# Patient Record
Sex: Male | Born: 1970 | Race: White | Hispanic: No | Marital: Single | Smoking: Never smoker
Health system: Southern US, Community
[De-identification: ages and names within clinical notes are randomized; demographics above are authoritative.]

## PROBLEM LIST (undated history)

## (undated) DIAGNOSIS — J45909 Unspecified asthma, uncomplicated: Secondary | ICD-10-CM

## (undated) DIAGNOSIS — F329 Major depressive disorder, single episode, unspecified: Secondary | ICD-10-CM

## (undated) DIAGNOSIS — E119 Type 2 diabetes mellitus without complications: Secondary | ICD-10-CM

## (undated) DIAGNOSIS — G4700 Insomnia, unspecified: Secondary | ICD-10-CM

## (undated) DIAGNOSIS — F32A Depression, unspecified: Secondary | ICD-10-CM

## (undated) HISTORY — PX: EYE SURGERY: SHX253

## (undated) HISTORY — PX: TONSILLECTOMY: SUR1361

---

## 2013-10-14 ENCOUNTER — Emergency Department (HOSPITAL_COMMUNITY): Payer: BC Managed Care – PPO

## 2013-10-14 ENCOUNTER — Encounter (HOSPITAL_COMMUNITY): Payer: Self-pay | Admitting: Emergency Medicine

## 2013-10-14 ENCOUNTER — Emergency Department (HOSPITAL_COMMUNITY)
Admission: EM | Admit: 2013-10-14 | Discharge: 2013-10-14 | Disposition: A | Discharge status: Home / Self Care | Payer: BC Managed Care – PPO | Attending: Emergency Medicine | Admitting: Emergency Medicine

## 2013-10-14 DIAGNOSIS — Y9289 Other specified places as the place of occurrence of the external cause: Secondary | ICD-10-CM | POA: Diagnosis not present

## 2013-10-14 DIAGNOSIS — F3289 Other specified depressive episodes: Secondary | ICD-10-CM | POA: Diagnosis not present

## 2013-10-14 DIAGNOSIS — W1809XA Striking against other object with subsequent fall, initial encounter: Secondary | ICD-10-CM | POA: Diagnosis not present

## 2013-10-14 DIAGNOSIS — G4700 Insomnia, unspecified: Secondary | ICD-10-CM | POA: Diagnosis not present

## 2013-10-14 DIAGNOSIS — R55 Syncope and collapse: Secondary | ICD-10-CM | POA: Diagnosis not present

## 2013-10-14 DIAGNOSIS — E119 Type 2 diabetes mellitus without complications: Secondary | ICD-10-CM | POA: Insufficient documentation

## 2013-10-14 DIAGNOSIS — S0100XA Unspecified open wound of scalp, initial encounter: Secondary | ICD-10-CM | POA: Insufficient documentation

## 2013-10-14 DIAGNOSIS — R404 Transient alteration of awareness: Secondary | ICD-10-CM | POA: Insufficient documentation

## 2013-10-14 DIAGNOSIS — S0101XA Laceration without foreign body of scalp, initial encounter: Secondary | ICD-10-CM

## 2013-10-14 DIAGNOSIS — Y9389 Activity, other specified: Secondary | ICD-10-CM | POA: Insufficient documentation

## 2013-10-14 DIAGNOSIS — F329 Major depressive disorder, single episode, unspecified: Secondary | ICD-10-CM | POA: Insufficient documentation

## 2013-10-14 DIAGNOSIS — Z79899 Other long term (current) drug therapy: Secondary | ICD-10-CM | POA: Diagnosis not present

## 2013-10-14 HISTORY — DX: Type 2 diabetes mellitus without complications: E11.9

## 2013-10-14 HISTORY — DX: Insomnia, unspecified: G47.00

## 2013-10-14 HISTORY — DX: Depression, unspecified: F32.A

## 2013-10-14 HISTORY — DX: Major depressive disorder, single episode, unspecified: F32.9

## 2013-10-14 LAB — CBC WITH DIFFERENTIAL/PLATELET
Basophils Absolute: 0 10*3/uL (ref 0.0–0.1)
Basophils Relative: 0 % (ref 0–1)
EOS ABS: 0.1 10*3/uL (ref 0.0–0.7)
EOS PCT: 1 % (ref 0–5)
HCT: 48.9 % (ref 39.0–52.0)
Hemoglobin: 16.5 g/dL (ref 13.0–17.0)
LYMPHS ABS: 1.7 10*3/uL (ref 0.7–4.0)
Lymphocytes Relative: 18 % (ref 12–46)
MCH: 30.3 pg (ref 26.0–34.0)
MCHC: 33.7 g/dL (ref 30.0–36.0)
MCV: 89.9 fL (ref 78.0–100.0)
MONO ABS: 0.8 10*3/uL (ref 0.1–1.0)
Monocytes Relative: 9 % (ref 3–12)
Neutro Abs: 7.1 10*3/uL (ref 1.7–7.7)
Neutrophils Relative %: 72 % (ref 43–77)
PLATELETS: 218 10*3/uL (ref 150–400)
RBC: 5.44 MIL/uL (ref 4.22–5.81)
RDW: 13.4 % (ref 11.5–15.5)
WBC: 9.8 10*3/uL (ref 4.0–10.5)

## 2013-10-14 LAB — URINALYSIS, ROUTINE W REFLEX MICROSCOPIC
Glucose, UA: NEGATIVE mg/dL
Hgb urine dipstick: NEGATIVE
Ketones, ur: 15 mg/dL — AB
Leukocytes, UA: NEGATIVE
Nitrite: NEGATIVE
PROTEIN: NEGATIVE mg/dL
Specific Gravity, Urine: 1.028 (ref 1.005–1.030)
UROBILINOGEN UA: 1 mg/dL (ref 0.0–1.0)
pH: 6 (ref 5.0–8.0)

## 2013-10-14 LAB — COMPREHENSIVE METABOLIC PANEL
ALT: 8 U/L (ref 0–53)
ANION GAP: 12 (ref 5–15)
AST: 10 U/L (ref 0–37)
Albumin: 3.8 g/dL (ref 3.5–5.2)
Alkaline Phosphatase: 52 U/L (ref 39–117)
BUN: 15 mg/dL (ref 6–23)
CALCIUM: 9.6 mg/dL (ref 8.4–10.5)
CO2: 28 mEq/L (ref 19–32)
CREATININE: 1.12 mg/dL (ref 0.50–1.35)
Chloride: 99 mEq/L (ref 96–112)
GFR calc Af Amer: >90 mL/min (ref >90)
GFR calc non Af Amer: 80 mL/min — ABNORMAL LOW (ref >90)
GLUCOSE: 117 mg/dL — AB (ref 70–99)
Potassium: 3.8 mEq/L (ref 3.7–5.3)
SODIUM: 139 meq/L (ref 137–147)
TOTAL PROTEIN: 7.3 g/dL (ref 6.0–8.3)
Total Bilirubin: 0.5 mg/dL (ref 0.3–1.2)

## 2013-10-14 LAB — TROPONIN I

## 2013-10-14 MED ORDER — TRAMADOL HCL 50 MG PO TABS: 50.0000 mg | ORAL_TABLET | Freq: Four times a day (QID) | ORAL | Status: DC | PRN

## 2013-10-14 MED ORDER — SODIUM CHLORIDE 0.9 % IV BOLUS (SEPSIS)
1000.0000 mL | Freq: Once | INTRAVENOUS | Status: AC
  Administered 2013-10-14: 1000 mL via INTRAVENOUS

## 2013-10-14 MED ORDER — IBUPROFEN 600 MG PO TABS: 600.0000 mg | ORAL_TABLET | Freq: Four times a day (QID) | ORAL | Status: DC | PRN

## 2013-10-14 NOTE — ED Notes (Signed)
Bed: WA09 Expected date: 10/14/13 Expected time: 1:52 AM Means of arrival: Ambulance Comments: Bed 9, EMS, 43 M, Syncopy

## 2013-10-14 NOTE — ED Notes (Signed)
Patient states he was at home and he has not been eating much because he is not hungry. Patient also took some sleeping medicine tonight and started to feel dizzy. Then the patient held on to the chair and then the next thing he knew he woke up on the floor. He stated that he was real sweaty. Patient feel fine now other than the pain from the laceration on the back of his head.

## 2013-10-14 NOTE — Discharge Instructions (Signed)
Head Injury °You have received a head injury. It does not appear serious at this time. Headaches and vomiting are common following head injury. It should be easy to awaken from sleeping. Sometimes it is necessary for you to stay in the emergency department for a while for observation. Sometimes admission to the hospital may be needed. After injuries such as yours, most problems occur within the first 24 hours, but side effects may occur up to 7-10 days after the injury. It is important for you to carefully monitor your condition and contact your health care provider or seek immediate medical care if there is a change in your condition. °WHAT ARE THE TYPES OF HEAD INJURIES? °Head injuries can be as minor as a bump. Some head injuries can be more severe. More severe head injuries include: °· A jarring injury to the brain (concussion). °· A bruise of the brain (contusion). This mean there is bleeding in the brain that can cause swelling. °· A cracked skull (skull fracture). °· Bleeding in the brain that collects, clots, and forms a bump (hematoma). °WHAT CAUSES A HEAD INJURY? °A serious head injury is most likely to happen to someone who is in a car wreck and is not wearing a seat belt. Other causes of major head injuries include bicycle or motorcycle accidents, sports injuries, and falls. °HOW ARE HEAD INJURIES DIAGNOSED? °A complete history of the event leading to the injury and your current symptoms will be helpful in diagnosing head injuries. Many times, pictures of the brain, such as CT or MRI are needed to see the extent of the injury. Often, an overnight hospital stay is necessary for observation.  °WHEN SHOULD I SEEK IMMEDIATE MEDICAL CARE?  °You should get help right away if: °· You have confusion or drowsiness. °· You feel sick to your stomach (nauseous) or have continued, forceful vomiting. °· You have dizziness or unsteadiness that is getting worse. °· You have severe, continued headaches not relieved by  medicine. Only take over-the-counter or prescription medicines for pain, fever, or discomfort as directed by your health care provider. °· You do not have normal function of the arms or legs or are unable to walk. °· You notice changes in the black spots in the center of the colored part of your eye (pupil). °· You have a clear or bloody fluid coming from your nose or ears. °· You have a loss of vision. °During the next 24 hours after the injury, you must stay with someone who can watch you for the warning signs. This person should contact local emergency services (911 in the [place] if you have seizures, you become unconscious, or you are unable to wake up. °HOW CAN I PREVENT A HEAD INJURY IN THE FUTURE? °The most important factor for preventing major head injuries is avoiding motor vehicle accidents.  To minimize the potential for damage to your head, it is crucial to wear seat belts while riding in motor vehicles. Wearing helmets while bike riding and playing collision sports (like football) is also helpful. Also, avoiding dangerous activities around the house will further help reduce your risk of head injury.  °WHEN CAN I RETURN TO NORMAL ACTIVITIES AND ATHLETICS? °You should be reevaluated by your health care provider before returning to these activities. If you have any of the following symptoms, you should not return to activities or contact sports until 1 week after the symptoms have stopped: °· Persistent headache. °· Dizziness or vertigo. °· Poor attention and concentration. °· Confusion. °·   Memory problems. °· Nausea or vomiting. °· Fatigue or tire easily. °· Irritability. °· Intolerant of bright lights or loud noises. °· Anxiety or depression. °· Disturbed sleep. °MAKE SURE YOU:  °· Understand these instructions. °· Will watch your condition. °· Will get help right away if you are not doing well or get worse. °Document Released: 02/07/2005 Document Revised: 02/12/2013 Document Reviewed:  10/15/2012 °Exit[employer]® Patient Information ©2015 [employer]. This information is not intended to replace advice given to you by your health care provider. Make sure you discuss any questions you have with your health care provider. ° °Syncope °Syncope is a medical term for fainting or passing out. This means you lose consciousness and drop to the ground. People are generally unconscious for less than 5 minutes. You may have some muscle twitches for up to 15 seconds before waking up and returning to normal. Syncope occurs more often in older adults, but it can happen to anyone. While most causes of syncope are not dangerous, syncope can be a sign of a serious medical problem. It is important to seek medical care.  °CAUSES  °Syncope is caused by a sudden drop in blood flow to the brain. The specific cause is often not determined. Factors that can bring on syncope include: °· Taking medicines that lower blood pressure. °· Sudden changes in posture, such as standing up quickly. °· Taking more medicine than prescribed. °· Standing in one place for too long. °· Seizure disorders. °· Dehydration and excessive exposure to heat. °· Low blood sugar (hypoglycemia). °· Straining to have a bowel movement. °· Heart disease, irregular heartbeat, or other circulatory problems. °· Fear, emotional distress, seeing blood, or severe pain. °SYMPTOMS  °Right before fainting, you may: °· Feel dizzy or light-headed. °· Feel nauseous. °· See all white or all black in your field of vision. °· Have cold, clammy skin. °DIAGNOSIS  °Your health care provider will ask about your symptoms, perform a physical exam, and perform an electrocardiogram (ECG) to record the electrical activity of your heart. Your health care provider may also perform other heart or blood tests to determine the cause of your syncope which may include: °· Transthoracic echocardiogram (TTE). During echocardiography, sound waves are used to evaluate how blood flows through  your heart. °· Transesophageal echocardiogram (TEE). °· Cardiac monitoring. This allows your health care provider to monitor your heart rate and rhythm in real time. °· Holter monitor. This is a portable device that records your heartbeat and can help diagnose heart arrhythmias. It allows your health care provider to track your heart activity for several days, if needed. °· Stress tests by exercise or by giving medicine that makes the heart beat faster. °TREATMENT  °In most cases, no treatment is needed. Depending on the cause of your syncope, your health care provider may recommend changing or stopping some of your medicines. °HOME CARE INSTRUCTIONS °· Have someone stay with you until you feel stable. °· Do not drive, use machinery, or play sports until your health care provider says it is okay. °· Keep all follow-up appointments as directed by your health care provider. °· Lie down right away if you start feeling like you might faint. Breathe deeply and steadily. Wait until all the symptoms have passed. °· Drink enough fluids to keep your urine clear or pale yellow. °· If you are taking blood pressure or heart medicine, get up slowly and take several minutes to sit and then stand. This can reduce dizziness. °SEEK IMMEDIATE MEDICAL CARE   IF:  °· You have a severe headache. °· You have unusual pain in the chest, abdomen, or back. °· You are bleeding from your mouth or rectum, or you have black or tarry stool. °· You have an irregular or very fast heartbeat. °· You have pain with breathing. °· You have repeated fainting or seizure-like jerking during an episode. °· You faint when sitting or lying down. °· You have confusion. °· You have trouble walking. °· You have severe weakness. °· You have vision problems. °If you fainted, call your local emergency services (911 in [place] Do not drive yourself to the hospital.  °MAKE SURE YOU: °· Understand these instructions. °· Will watch your condition. °· Will get help right  away if you are not doing well or get worse. °Document Released: 02/07/2005 Document Revised: 02/12/2013 Document Reviewed: 04/08/2011 °Exit[employer]® Patient Information ©2015 [employer]. This information is not intended to replace advice given to you by your health care provider. Make sure you discuss any questions you have with your health care provider. ° °

## 2013-10-14 NOTE — ED Provider Notes (Signed)
CSN: [id number]     Arrival date & time 10/14/13  0215 History   First MD Initiated Contact with Patient 10/14/13 [phone]     Chief Complaint  Patient presents with  . Loss of Consciousness     (Consider location/radiation/quality/duration/timing/severity/associated sxs/prior Treatment) The history is limited by the condition of the patient.   Patient states that earlier this evening he stood up from the couch. He became lightheaded. He then fell to the ground. Loss of consciousness for an unknown length of time. He struck his head had a small laceration to the right occiput. His last tetanus can last 2 years. He no longer has any lightheadedness. He denies any hearing changes. He's had no vision changes. He's had no focal weakness or numbness. Patient denied any chest pain or shortness of breath at any point. Patient denies any neck pain. Past Medical History  Diagnosis Date  . Depression   . Insomnia   . Diabetes mellitus without complication    Past Surgical History  Procedure Laterality Date  . Eye surgery      Cataract removed   History reviewed. No pertinent family history. History  Substance Use Topics  . Smoking status: Never Smoker   . Smokeless tobacco: Never Used  . Alcohol Use: No    Review of Systems  Constitutional: Negative for fever and chills.  Eyes: Negative for visual disturbance.  Respiratory: Negative for shortness of breath and wheezing.   Cardiovascular: Negative for chest pain, palpitations and leg swelling.  Gastrointestinal: Negative for nausea, vomiting, abdominal pain and diarrhea.  Musculoskeletal: Negative for back pain, neck pain and neck stiffness.  Skin: Positive for wound.  Neurological: Positive for dizziness, syncope and light-headedness. Negative for weakness, numbness and headaches.  All other systems reviewed and are negative.     Allergies  Review of patient's allergies indicates no known allergies.  Home Medications   Prior to  Admission medications   Medication Sig Start Date End Date Taking? Authorizing Provider  FLUoxetine (PROZAC) 20 MG tablet Take 20 mg by mouth daily.   Yes Historical Provider, MD  traZODone (DESYREL) 100 MG tablet Take 100 mg by mouth at bedtime as needed for sleep.   Yes Historical Provider, MD   BP 123/65  Pulse 56  Temp(Src) 98 F (36.7 C) (Oral)  Resp 18  SpO2 99% Physical Exam  Nursing note and vitals reviewed. Constitutional: He is oriented to person, place, and time. He appears well-developed and well-nourished. No distress.  HENT:  Head: Normocephalic and atraumatic.  Mouth/Throat: Oropharynx is clear and moist.  Once in a laceration to the scalp in the occipital region on the right. No active bleeding.  Eyes: EOM are normal. Pupils are equal, round, and reactive to light.  Neck: Normal range of motion. Neck supple.  No posterior midline cervical tenderness palpation.  Cardiovascular: Normal rate and regular rhythm.   Pulmonary/Chest: Effort normal and breath sounds normal. No respiratory distress. He has no wheezes. He has no rales.  Abdominal: Soft. Bowel sounds are normal.  Musculoskeletal: Normal range of motion. He exhibits no edema and no tenderness.  No calf swelling or tenderness.  Neurological: He is alert and oriented to person, place, and time.  Patient is alert and oriented x3 with clear, goal oriented speech. Patient has 5/5 motor in all extremities. Sensation is intact to light touch. Bilateral finger-to-nose is normal with no signs of dysmetria. Patient has a normal gait and walks without assistance.   Skin: Skin is  warm and dry. No rash noted. No erythema.  Psychiatric: He has a normal mood and affect. His behavior is normal.    ED Course  LACERATION REPAIR Date/Time: 10/14/2013 4:37 AM Performed by: Loren Racer Authorized by: Ranae Palms, Wyndell Cardiff Patient identity confirmed: verbally with patient Body area: head/neck Location details: scalp Laceration  length: 2 cm Foreign bodies: no foreign bodies Tendon involvement: none Nerve involvement: none Vascular damage: no Irrigation solution: saline Amount of cleaning: standard Debridement: none Degree of undermining: none Skin closure: staples Number of sutures: 2 Approximation: close Approximation difficulty: simple   (including critical care time) Labs Review Labs Reviewed  COMPREHENSIVE METABOLIC PANEL - Abnormal; Notable for the following:    Glucose, Bld 117 (*)    GFR calc non Af Amer 80 (*)    All other components within normal limits  URINALYSIS, ROUTINE W REFLEX MICROSCOPIC - Abnormal; Notable for the following:    Color, Urine AMBER (*)    Bilirubin Urine SMALL (*)    Ketones, ur 15 (*)    All other components within normal limits  CBC WITH DIFFERENTIAL  TROPONIN I    Imaging Review Ct Head Wo Contrast  10/14/2013   CLINICAL DATA:  Patient passed out, fell and hit the back of head. Laceration to the back of head.  EXAM: CT HEAD WITHOUT CONTRAST  TECHNIQUE: Contiguous axial images were obtained from the base of the skull through the vertex without intravenous contrast.  COMPARISON:  None.  FINDINGS: Ventricles and sulci appear symmetrical. No mass effect or midline shift. No abnormal extra-axial fluid collections. Gray-white matter junctions are distinct. Basal cisterns are not effaced. No evidence of acute intracranial hemorrhage. No depressed skull fractures. Visualized paranasal sinuses and mastoid air cells are not opacified.  IMPRESSION: No acute intracranial abnormalities.   Electronically Signed   By: Burman Nieves M.D.   On: 10/14/2013 03:47     EKG Interpretation None      Date: 10/14/2013  Rate: 60  Rhythm: normal sinus rhythm  QRS Axis: normal  Intervals: normal  ST/T Wave abnormalities: normal  Conduction Disutrbances:none  Narrative Interpretation:   Old EKG Reviewed: none available   MDM   Final diagnoses:  None   Remained asymptomatic in  the emergency apartment. Laceration repaired.  Work up is negative. Suspect mild dehydration. No further lightheadedness in the emergency department. Ambulating without difficulty. Head injury precautions given. Advised that staples removed in one week.   Loren Racer, MD 10/14/13 [phone]

## 2013-10-17 ENCOUNTER — Encounter (HOSPITAL_COMMUNITY): Payer: Self-pay | Admitting: Emergency Medicine

## 2013-10-17 ENCOUNTER — Emergency Department (HOSPITAL_COMMUNITY)
Admission: EM | Admit: 2013-10-17 | Discharge: 2013-10-17 | Disposition: A | Discharge status: Home / Self Care | Payer: BC Managed Care – PPO | Attending: Emergency Medicine | Admitting: Emergency Medicine

## 2013-10-17 DIAGNOSIS — F329 Major depressive disorder, single episode, unspecified: Secondary | ICD-10-CM | POA: Diagnosis not present

## 2013-10-17 DIAGNOSIS — G4700 Insomnia, unspecified: Secondary | ICD-10-CM | POA: Insufficient documentation

## 2013-10-17 DIAGNOSIS — E119 Type 2 diabetes mellitus without complications: Secondary | ICD-10-CM | POA: Insufficient documentation

## 2013-10-17 DIAGNOSIS — Z79899 Other long term (current) drug therapy: Secondary | ICD-10-CM | POA: Diagnosis not present

## 2013-10-17 DIAGNOSIS — L02412 Cutaneous abscess of left axilla: Secondary | ICD-10-CM

## 2013-10-17 DIAGNOSIS — IMO0002 Reserved for concepts with insufficient information to code with codable children: Secondary | ICD-10-CM | POA: Diagnosis not present

## 2013-10-17 DIAGNOSIS — F3289 Other specified depressive episodes: Secondary | ICD-10-CM | POA: Diagnosis not present

## 2013-10-17 MED ORDER — SULFAMETHOXAZOLE-TRIMETHOPRIM 800-160 MG PO TABS: 1.0000 | ORAL_TABLET | Freq: Two times a day (BID) | ORAL | Status: AC

## 2013-10-17 NOTE — Discharge Instructions (Signed)
Keep your wound clean and dry.   Abscess Care After An abscess (also called a boil or furuncle) is an infected area that contains a collection of pus. Signs and symptoms of an abscess include pain, tenderness, redness, or hardness, or you may feel a moveable soft area under your skin. An abscess can occur anywhere in the body. The infection may spread to surrounding tissues causing cellulitis. A cut (incision) by the surgeon was made over your abscess and the pus was drained out. Gauze may have been packed into the space to provide a drain that will allow the cavity to heal from the inside outwards. The boil may be painful for 5 to 7 days. Most people with a boil do not have high fevers. Your abscess, if seen early, may not have localized, and may not have been lanced. If not, another appointment may be required for this if it does not get better on its own or with medications. HOME CARE INSTRUCTIONS   Only take over-the-counter or prescription medicines for pain, discomfort, or fever as directed by your caregiver.  When you bathe, soak and then remove gauze or iodoform packs at least daily or as directed by your caregiver. You may then wash the wound gently with mild soapy water. Repack with gauze or do as your caregiver directs. SEEK IMMEDIATE MEDICAL CARE IF:   You develop increased pain, swelling, redness, drainage, or bleeding in the wound site.  You develop signs of generalized infection including muscle aches, chills, fever, or a general ill feeling.  An oral temperature above 102 F (38.9 C) develops, not controlled by medication. See your caregiver for a recheck if you develop any of the symptoms described above. If medications (antibiotics) were prescribed, take them as directed. Document Released: 08/26/2004 Document Revised: 05/02/2011 Document Reviewed: 04/23/2007 [facility] Patient Information 2015 [place], [place]. This information is not intended to replace advice given to you by  your health care provider. Make sure you discuss any questions you have with your health care provider.

## 2013-10-17 NOTE — ED Notes (Signed)
Red, swollen, painfull abscess in l/axilla. 1cm diameter red raised area noted

## 2013-10-17 NOTE — ED Provider Notes (Signed)
CSN: [id number]     Arrival date & time 10/17/13  1823 History   None  This chart was scribed for non-physician practitioner Ivonne Andrew PA-C working with Raeford Razor, MD by Gwenevere Abbot, ED scribe. This patient was seen in room WTR5/WTR5 and the patient's care was started at 8:02 PM.   Chief Complaint  Patient presents with  . Abscess    l/axillary absecess    HPI HPI Comments:  Devin [name] is a 43 y.o. male who presents to the Emergency Department complaining of an abscess under the left arm with associated symptoms of pain and drainage, onset 3 days ago. Pt reports that he attempted to puncture the abscess with a needle two days in a row, and he began to experience drainage. Pt denies fever.      Past Medical History  Diagnosis Date  . Depression   . Insomnia   . Diabetes mellitus without complication    Past Surgical History  Procedure Laterality Date  . Eye surgery      Cataract removed   Family History  Problem Relation Age of Onset  . Cancer Mother   . Cancer Father    History  Substance Use Topics  . Smoking status: Never Smoker   . Smokeless tobacco: Never Used  . Alcohol Use: No    Review of Systems  Constitutional: Negative for fever, chills and diaphoresis.  All other systems reviewed and are negative.     Allergies  Review of patient's allergies indicates no known allergies.  Home Medications   Prior to Admission medications   Medication Sig Start Date End Date Taking? Authorizing Provider  FLUoxetine (PROZAC) 20 MG tablet Take 20 mg by mouth daily.   Yes Historical Provider, MD  traMADol (ULTRAM) 50 MG tablet Take 50 mg by mouth every 6 (six) hours as needed for moderate pain.   Yes Historical Provider, MD  traZODone (DESYREL) 100 MG tablet Take 100 mg by mouth at bedtime as needed for sleep.   Yes Historical Provider, MD  ibuprofen (ADVIL,MOTRIN) 600 MG tablet Take 1 tablet (600 mg total) by mouth every 6 (six) hours as needed.  10/14/13   Loren Racer, MD   BP 120/64  Pulse 66  Temp(Src) 98.5 F (36.9 C) (Oral)  Resp 18  SpO2 100% Physical Exam  Nursing note and vitals reviewed. Constitutional: He is oriented to person, place, and time. He appears well-developed and well-nourished. No distress.  HENT:  Head: Normocephalic.  Cardiovascular: Normal rate and regular rhythm.   Pulmonary/Chest: Effort normal and breath sounds normal. No respiratory distress. He has no wheezes.  Musculoskeletal: Normal range of motion.  Neurological: He is alert and oriented to person, place, and time.  Skin: Skin is warm.  Nodular swelling with erythema to left axilla. Small purulent drainage.  Psychiatric: He has a normal mood and affect. His behavior is normal.    ED Course  Procedures  DIAGNOSTIC STUDIES: Oxygen Saturation is 100% on RA, normal by my interpretation.  COORDINATION OF CARE: 8:06 PM-Discussed treatment plan with pt at bedside and pt agreed to plan.  INCISION AND DRAINAGE Performed by: Angus Seller Consent: Verbal consent obtained. Risks and benefits: risks, benefits and alternatives were discussed Type: abscess  Body area: left axillary  Anesthesia: local infiltration  Incision was made with a scalpel.  Local anesthetic: lidocaine 2% with epinephrine  Anesthetic total: 3 ml  Complexity: complex Blunt dissection to break up loculations  Drainage: purulent  Drainage amount: moderate  Packing material: none  Patient tolerance: Patient tolerated the procedure well with no immediate complications.    MDM   Final diagnoses:  Abscess of axilla, left    I personally performed the services described in this documentation, which was scribed in my presence. The recorded information has been reviewed and is accurate.      Angus Seller, PA-C 10/17/13 2106

## 2013-10-18 NOTE — ED Provider Notes (Signed)
Medical screening examination/treatment/procedure(s) were performed by non-physician practitioner and as supervising physician I was immediately available for consultation/collaboration.   EKG Interpretation None       Yvette Loveless, MD 10/18/13 1615 

## 2013-10-21 ENCOUNTER — Encounter (HOSPITAL_COMMUNITY): Payer: Self-pay | Admitting: Emergency Medicine

## 2013-10-21 ENCOUNTER — Emergency Department (HOSPITAL_COMMUNITY)
Admission: EM | Admit: 2013-10-21 | Discharge: 2013-10-21 | Disposition: A | Discharge status: Home / Self Care | Payer: BC Managed Care – PPO | Attending: Emergency Medicine | Admitting: Emergency Medicine

## 2013-10-21 DIAGNOSIS — F3289 Other specified depressive episodes: Secondary | ICD-10-CM | POA: Diagnosis not present

## 2013-10-21 DIAGNOSIS — F329 Major depressive disorder, single episode, unspecified: Secondary | ICD-10-CM | POA: Diagnosis not present

## 2013-10-21 DIAGNOSIS — E119 Type 2 diabetes mellitus without complications: Secondary | ICD-10-CM | POA: Diagnosis not present

## 2013-10-21 DIAGNOSIS — Z4802 Encounter for removal of sutures: Secondary | ICD-10-CM | POA: Diagnosis not present

## 2013-10-21 DIAGNOSIS — Z79899 Other long term (current) drug therapy: Secondary | ICD-10-CM | POA: Insufficient documentation

## 2013-10-21 NOTE — ED Notes (Addendum)
Pt here to get staples taken out of back of head  that where placed last Monday.  Pt also wants to get cyst in left axilla looked at as well.

## 2013-10-21 NOTE — ED Provider Notes (Addendum)
CSN: [id number]     Arrival date & time 10/21/13  4098 History   First MD Initiated Contact with Patient 10/21/13 0848     Chief Complaint  Patient presents with  . Suture / Staple Removal  . Abscess     (Consider location/radiation/quality/duration/timing/severity/associated sxs/prior Treatment) HPI Comments: Patient presents for staple removal. She was here one week ago after a syncopal episode and had a laceration to his scalp area which was repaired with 2 staples. He states it's doing well. He denies any ongoing headaches or dizziness. He denies any drainage from the wound. He states his tetanus shot is up-to-date. He also is here a few days ago and had an I&D of an abscess in his left axilla. He also states it's doing better with less pain and less drainage. He denies he fevers.  Patient is a 43 y.o. Devin [name] presenting with suture removal and abscess.  Suture / Staple Removal  Abscess Associated symptoms: no fever, no nausea and no vomiting     Past Medical History  Diagnosis Date  . Depression   . Insomnia   . Diabetes mellitus without complication    Past Surgical History  Procedure Laterality Date  . Eye surgery      Cataract removed   Family History  Problem Relation Age of Onset  . Cancer Mother   . Cancer Father    History  Substance Use Topics  . Smoking status: Never Smoker   . Smokeless tobacco: Never Used  . Alcohol Use: No    Review of Systems  Constitutional: Negative for fever.  Gastrointestinal: Negative for nausea and vomiting.  Skin: Positive for wound.  Neurological: Negative for dizziness and light-headedness.      Allergies  Review of patient's allergies indicates no known allergies.  Home Medications   Prior to Admission medications   Medication Sig Start Date End Date Taking? Authorizing Provider  FLUoxetine (PROZAC) 20 MG tablet Take 20 mg by mouth daily.    Historical Provider, MD  ibuprofen (ADVIL,MOTRIN) 600 MG tablet Take 1  tablet (600 mg total) by mouth every 6 (six) hours as needed. 8/Devin/15   Loren Racer, MD  sulfamethoxazole-trimethoprim (BACTRIM DS,SEPTRA DS) 800-160 MG per tablet Take 1 tablet by mouth 2 (two) times daily. 10/17/13 10/24/13  Phill Mutter Dammen, PA-C  traMADol (ULTRAM) 50 MG tablet Take 50 mg by mouth every 6 (six) hours as needed for moderate pain.    Historical Provider, MD  traZODone (DESYREL) 100 MG tablet Take 100 mg by mouth at bedtime as needed for sleep.    Historical Provider, MD   BP 103/64  Pulse 62  Temp(Src) 98 F (36.7 C) (Oral)  Resp 17  Ht 5' 9.5" (1.765 m)  SpO2 95% Physical Exam  Constitutional: He appears well-developed and well-nourished.  Skin: Skin is warm and dry.  Healing laceration to the posterior scalp area. There's 2 staples in place. There's no swelling erythema or drainage. He also has a healing abscess to his left axilla. There some surrounding induration but no fluctuance. There is minimal erythema to the wound. Some serous drainage is noted.    ED Course  Procedures (including critical care time) Labs Review Labs Reviewed - No data to display  Imaging Review No results found.   EKG Interpretation None      MDM   Final diagnoses:  Encounter for staple removal   2 staples removed in ED. His laceration appears to be well healing. His abscess appears to  be well healing. He was given wound care instructions and advice return as needed for any additional care.    Rolan Bucco, MD 10/21/13 0272  Rolan Bucco, MD 10/21/13 [id number]

## 2013-10-21 NOTE — Discharge Instructions (Signed)
Incision Care °An incision is when a surgeon cuts into your body tissues. After surgery, the incision needs to be cared for properly to prevent infection.  °HOME CARE INSTRUCTIONS  °· Take all medicine as directed by your caregiver. Only take over-the-counter or prescription medicines for pain, discomfort, or fever as directed by your caregiver. °· Do not remove your bandage (dressing) or get your incision wet until your surgeon gives you permission. In the event that your dressing becomes wet, dirty, or starts to smell, change the dressing and call your surgeon for instructions as soon as possible. °· Take showers. Do not take tub baths, swim, or do anything that may soak the wound until it is healed. °· Resume your normal diet and activities as directed or allowed. °· Avoid lifting any weight until you are instructed otherwise. °· Use anti-itch antihistamine medicine as directed by your caregiver. The wound may itch when it is healing. Do not pick or scratch at the wound. °· Follow up with your caregiver for stitch (suture) or staple removal as directed. °· Drink enough fluids to keep your urine clear or pale yellow. °SEEK MEDICAL CARE IF:  °· You have redness, swelling, or increasing pain in the wound that is not controlled with medicine. °· You have drainage, blood, or pus coming from the wound that lasts longer than 1 day. °· You develop muscle aches, chills, or a general ill feeling. °· You notice a bad smell coming from the wound or dressing. °· Your wound edges separate after the sutures, staples, or skin adhesive strips have been removed. °· You develop persistent nausea or vomiting. °SEEK IMMEDIATE MEDICAL CARE IF:  °· You have a fever. °· You develop a rash. °· You develop dizzy episodes or faint while standing. °· You have difficulty breathing. °· You develop any reaction or side effects to medicine given. °MAKE SURE YOU:  °· Understand these instructions. °· Will watch your condition. °· Will get help  right away if you are not doing well or get worse. °Document Released: 08/27/2004 Document Revised: 05/02/2011 Document Reviewed: 04/03/2013 °[employer]® Patient Information ©2015 [employer]. This information is not intended to replace advice given to you by your health care provider. Make sure you discuss any questions you have with your health care provider. ° ° °

## 2014-04-13 ENCOUNTER — Emergency Department (HOSPITAL_COMMUNITY)
Admission: EM | Admit: 2014-04-13 | Discharge: 2014-04-13 | Disposition: A | Discharge status: Home / Self Care | Payer: BLUE CROSS/BLUE SHIELD | Attending: Emergency Medicine | Admitting: Emergency Medicine

## 2014-04-13 ENCOUNTER — Encounter (HOSPITAL_COMMUNITY): Payer: Self-pay | Admitting: *Deleted

## 2014-04-13 DIAGNOSIS — M791 Myalgia, unspecified site: Secondary | ICD-10-CM

## 2014-04-13 DIAGNOSIS — G4700 Insomnia, unspecified: Secondary | ICD-10-CM | POA: Insufficient documentation

## 2014-04-13 DIAGNOSIS — F329 Major depressive disorder, single episode, unspecified: Secondary | ICD-10-CM | POA: Insufficient documentation

## 2014-04-13 DIAGNOSIS — Z79899 Other long term (current) drug therapy: Secondary | ICD-10-CM | POA: Insufficient documentation

## 2014-04-13 DIAGNOSIS — E119 Type 2 diabetes mellitus without complications: Secondary | ICD-10-CM | POA: Insufficient documentation

## 2014-04-13 DIAGNOSIS — R52 Pain, unspecified: Secondary | ICD-10-CM | POA: Diagnosis present

## 2014-04-13 MED ORDER — IBUPROFEN 200 MG PO TABS
400.0000 mg | ORAL_TABLET | Freq: Once | ORAL | Status: AC
  Administered 2014-04-13: 400 mg via ORAL
  Filled 2014-04-13: qty 2

## 2014-04-13 NOTE — ED Notes (Signed)
Pt with multiple c/o not feeling well, headache, body aches since last night and neck pain for x 3 weeks, Denies N/V/D

## 2014-04-13 NOTE — ED Provider Notes (Signed)
CSN: [id number]     Arrival date & time 04/13/14  1011 History   First MD Initiated Contact with Patient 04/13/14 1036     Chief Complaint  Patient presents with  . Headache  . Generalized Body Aches     (Consider location/radiation/quality/duration/timing/severity/associated sxs/prior Treatment) HPI   Craigory Toste is a 44 y.o. male who presents for evaluation of muscle aches, headaches, right-sided chest pain and right lateral neck pain.  These discomforts are intermittent and persistent over 3 weeks' time.  He denies fever, chills, nausea, vomiting, weakness or dizziness.  He's taking his usual medications.  He works in a Systems developer job, had a Chief Financial Officer.  There are no other known modifying factors.   Past Medical History  Diagnosis Date  . Depression   . Insomnia   . Diabetes mellitus without complication    Past Surgical History  Procedure Laterality Date  . Eye surgery      Cataract removed   Family History  Problem Relation Age of Onset  . Cancer Mother   . Cancer Father    History  Substance Use Topics  . Smoking status: Never Smoker   . Smokeless tobacco: Never Used  . Alcohol Use: No    Review of Systems  All other systems reviewed and are negative.     Allergies  Review of patient's allergies indicates no known allergies.  Home Medications   Prior to Admission medications   Medication Sig Start Date End Date Taking? Authorizing Provider  traZODone (DESYREL) 100 MG tablet Take 100 mg by mouth at bedtime as needed for sleep.   Yes Historical Provider, MD  FLUoxetine (PROZAC) 20 MG tablet Take 20 mg by mouth daily.    Historical Provider, MD   BP 123/72 mmHg  Pulse 72  Temp(Src) 98.2 F (36.8 C) (Oral)  Resp 16  SpO2 96% Physical Exam  Constitutional: He is oriented to person, place, and time. He appears well-developed and well-nourished. No distress.  HENT:  Head: Normocephalic and atraumatic.  Right Ear: External ear  normal.  Left Ear: External ear normal.  Eyes: Conjunctivae and EOM are normal. Pupils are equal, round, and reactive to light.  Neck: Normal range of motion and phonation normal. Neck supple.  No meningismus.  Cardiovascular: Normal rate, regular rhythm and normal heart sounds.   Pulmonary/Chest: Effort normal and breath sounds normal. He exhibits no bony tenderness.  Abdominal: Soft. There is no tenderness.  Musculoskeletal: Normal range of motion.  Neurological: He is alert and oriented to person, place, and time. No cranial nerve deficit or sensory deficit. He exhibits normal muscle tone. Coordination normal.  No dysarthria and aphasia or nystagmus.  Normal gait.  Skin: Skin is warm, dry and intact.  Psychiatric: He has a normal mood and affect. His behavior is normal. Judgment and thought content normal.  Nursing note and vitals reviewed.   ED Course  Procedures (including critical care time)  Medications  ibuprofen (ADVIL,MOTRIN) tablet 400 mg (400 mg Oral Given 04/13/14 1124)    Patient Vitals for the past 24 hrs:  BP Temp Temp src Pulse Resp SpO2  04/13/14 1023 123/72 mmHg 98.2 F (36.8 C) Oral 72 16 96 %    Findings discussed with patient, all questions answered.  Labs Review Labs Reviewed - No data to display  Imaging Review No results found.   EKG Interpretation None      MDM   Final diagnoses:  Myalgia    Nonspecific myalgia, with headache.  No evidence for serous bacterial infection. metabolic instability or impending vascular collapse.  Nursing Notes Reviewed/ Care Coordinated Applicable Imaging Reviewed Interpretation of Laboratory Data incorporated into ED treatment  The patient appears reasonably screened and/or stabilized for discharge and I doubt any other medical condition or other [facility] requiring further screening, evaluation, or treatment in the ED at this time prior to discharge.  Plan: Home Medications- IBU; Home Treatments- rest, work  release 1 day; return here if the recommended treatment, does not improve the symptoms; Recommended follow up- PCP prn    Flint MelterElliott L Shernita Rabinovich, MD 04/13/14 1132

## 2014-04-13 NOTE — Discharge Instructions (Signed)
Take ibuprofen 400 mg 3 times a day with meals, for 5 days. Use heat on the sore area 2 or 3 times a day. Follow-up with a primary care doctor, if you're not better in 1 week.   Muscle Pain Muscle pain (myalgia) may be caused by many things, including:  Overuse or muscle strain, especially if you are not in shape. This is the most common cause of muscle pain.  Injury.  Bruises.  Viruses, such as the flu.  Infectious diseases.  Fibromyalgia, which is a chronic condition that causes muscle tenderness, fatigue, and headache.  Autoimmune diseases, including lupus.  Certain drugs, including ACE inhibitors and statins. Muscle pain may be mild or severe. In most cases, the pain lasts only a short time and goes away without treatment. To diagnose the cause of your muscle pain, your health care provider will take your medical history. This means he or she will ask you when your muscle pain began and what has been happening. If you have not had muscle pain for very long, your health care provider may want to wait before doing much testing. If your muscle pain has lasted a long time, your health care provider may want to run tests right away. If your health care provider thinks your muscle pain may be caused by illness, you may need to have additional tests to rule out certain conditions.  Treatment for muscle pain depends on the cause. Home care is often enough to relieve muscle pain. Your health care provider may also prescribe anti-inflammatory medicine. HOME CARE INSTRUCTIONS Watch your condition for any changes. The following actions may help to lessen any discomfort you are feeling:  Only take over-the-counter or prescription medicines as directed by your health care provider.  Apply ice to the sore muscle:  Put ice in a plastic bag.  Place a towel between your skin and the bag.  Leave the ice on for 15-20 minutes, 3-4 times a day.  You may alternate applying hot and cold packs to  the muscle as directed by your health care provider.  If overuse is causing your muscle pain, slow down your activities until the pain goes away.  Remember that it is normal to feel some muscle pain after starting a workout program. Muscles that have not been used often will be sore at first.  Do regular, gentle exercises if you are not usually active.  Warm up before exercising to lower your risk of muscle pain.  Do not continue working out if the pain is very bad. Bad pain could mean you have injured a muscle. SEEK MEDICAL CARE IF:  Your muscle pain gets worse, and medicines do not help.  You have muscle pain that lasts longer than 3 days.  You have a rash or fever along with muscle pain.  You have muscle pain after a tick bite.  You have muscle pain while working out, even though you are in good physical condition.  You have redness, soreness, or swelling along with muscle pain.  You have muscle pain after starting a new medicine or changing the dose of a medicine. SEEK IMMEDIATE MEDICAL CARE IF:  You have trouble breathing.  You have trouble swallowing.  You have muscle pain along with a stiff neck, fever, and vomiting.  You have severe muscle weakness or cannot move part of your body. MAKE SURE YOU:   Understand these instructions.  Will watch your condition.  Will get help right away if you are not doing  well or get worse. Document Released: 12/30/2005 Document Revised: 02/12/2013 Document Reviewed: 12/04/2012 [facility] Patient Information 2015 [place], [place]. This information is not intended to replace advice given to you by your health care provider. Make sure you discuss any questions you have with your health care provider.    Emergency Department Resource Guide 1) Find a Doctor and Pay Out of Pocket Although you won't have to find out who is covered by your insurance plan, it is a good idea to ask around and get recommendations. You will then need to call  the office and see if the doctor you have chosen will accept you as a new patient and what types of options they offer for patients who are self-pay. Some doctors offer discounts or will set up payment plans for their patients who do not have insurance, but you will need to ask so you aren't surprised when you get to your appointment.  2) Contact Your Local Health Department Not all health departments have doctors that can see patients for sick visits, but many do, so it is worth a call to see if yours does. If you don't know where your local health department is, you can check in your phone book. The CDC also has a tool to help you locate your state's health department, and many state websites also have listings of all of their local health departments.  3) Find a Walk-in Clinic If your illness is not likely to be very severe or complicated, you may want to try a walk in clinic. These are popping up all over the country in pharmacies, drugstores, and shopping centers. They're usually staffed by nurse practitioners or physician assistants that have been trained to treat common illnesses and complaints. They're usually fairly quick and inexpensive. However, if you have serious medical issues or chronic medical problems, these are probably not your best option.  No Primary Care Doctor: - Call Health Connect at  [phone] - they can help you locate a primary care doctor that  accepts your insurance, provides certain services, etc. - Physician Referral Service- [phone]  Chronic Pain Problems: Organization         Address  Phone   Notes  [facility]  [phone] Patients need to be referred by their primary care doctor.   Medication Assistance: Organization         Address  Phone   Notes  [facility] Med[facility] [address] [place] [place], [place] [address] [phone] --Must be a resident of [facility] -- Must have NO insurance  coverage whatsoever (no Medicaid/ Medicare, etc.) -- The pt. MUST have a primary care doctor that directs their care regularly and follows them in the community   [facility]  [phone]   [employer]  [phone]    Agencies that provide inexpensive medical care: Organization         Address  Phone   Notes  [facility]  [phone]   [facility]    [phone]   [facility] [address] [place], [place] [address] [phone]   [facility] [address] [place] [place] [phone]   Planned Parenthood    [phone]   [facility]    [phone]   [facility] and [facility]  [address], [place] Phone:  [phone], Fax:  [phone] Hours of Operation:  9  am - 6 pm, M-F.  Also accepts Medicaid/Medicare and self-pay.  [facility]  [address], [place] Phone: [phone](336) [phone], Fax: [phone]. Hours of Operation:  8:30 am - 5:30 pm, M-F.  Also accepts Medicaid and self-pay.  [facility] [place] [address], [place] Phone: [phone]) [phone]   [facility] [address], [place], [place] [phone], Ext. [phone] Mondays & Thursdays: 7-9 AM.  First 15 patients are seen on a first come, first serve basis.    Medicaid-accepting [facility] Providers:  Organization         Address  Phone   Notes  [facility] [address], [place] [phone]) [phone] Also accepts self-pay patients.  [facility] [address] [place], [place]  [phone]) [phone]   [facility] [address], [place] [phone]) [phone]   [facility] [address] [place] [address], [place] [phone]) [phone]   Renaye RakersVeita Bland [address], [place]   [phone] Only accepts [place] Access [place] patients after they have their  name applied to their card.   Self-Pay (no insurance) in [facility]:  Organization         Address  Phone   Notes  Sickle Cell Patients, [facility] [address], [place] [phone]) [phone]   [facility] [address], [place] [phone]) [phone]   [facility]  [address], [place] [phone]) [phone]   [facility]/Dr. Osei-Bonsu  [address] [place] [address], [place] or [phone], [place] [phone]) [phone] Phone number for both [place] and [place] locations is the same.  [facility] [address], [place] [phone]) [phone]   [facility] [address] [place] [address], [place] or [address] [phone]) [phone]) [phone]   [facility] [address], [place] [phone](336) [phone], phone; [phone](336) [phone], fax Sees patients 1st and 3rd Saturday of every month.  Must not qualify for public or private insurance (i.e. Medicaid, Medicare, [place] Health Choice, Veterans' Benefits)  Household income should be no more than 200% of the poverty level The clinic cannot treat you if you are pregnant or think you are pregnant  Sexually transmitted diseases are not treated at the clinic.    Dental Care: Organization         Address  Phone  Notes  [facility] [address], [place] [phone]) [phone] Accepts children up to age 42 who are enrolled in [place] or [place] Health Choice; pregnant women with a Medicaid card; and children who have applied for Medicaid or [place] Health Choice, but were declined, whose parents can pay a reduced fee at time of service.  [facility] of [facility] [place]  [address], [place] [phone]) [phone] Accepts children up to age 42 who are enrolled in [place] or [place] Health Choice; pregnant women with a Medicaid card; and children who have applied for  Medicaid or [place] Health Choice, but were declined, whose parents can pay a reduced fee at time of service.  [facility]  [address], [place] [phone]) [phone] Patients are seen by appointment only. Walk-ins are not accepted. [facility] will see patients 18 years of  age and older. Monday - Tuesday (8am-5pm) Most Wednesdays (8:30-5pm) $30 per visit, cash only  [facility] Access [facility]  [address], [facility] [phone] Patients are seen by appointment only. Walk-ins are not accepted. [facility] will see patients 89 years of age and older. One Wednesday Evening (Monthly: Volunteer Based).  $30 per visit, cash only  [employer]  [phone] for adults; Children under age 85, call Graduate Pediatric Dentistry at [phone]. Children aged 85-14, please call [phone] to request a pediatric application.  Dental services are provided in all areas of dental care including fillings, crowns and bridges, complete and partial dentures, implants, gum treatment, root canals, and extractions. Preventive care is also provided. Treatment is provided to both adults and children. Patients are selected via a lottery and there is often a waiting list.   [facility] [address], [place]  [phone] [email or web address]   [facility] [address], [place] [phone], Ext. [phone] Second and Fourth Thursday of each month, opens at 6:30 AM; Clinic ends at 9 AM.  Patients are seen on a first-come first-served basis, and a limited number are seen during each clinic.   [facility]  [address] [place], [place] [phone]   Eligibility Requirements You must have lived in [place], [place], or [place] counties for at least the last three months.   You cannot be eligible for state or federal sponsored [place], including [employer], [place],  or [employer].   You generally cannot be eligible for healthcare insurance through your employer.    How to apply: Eligibility screenings are held every Tuesday and Wednesday afternoon from 1:00 pm until 4:00 pm. You do not need an appointment for the interview!  [facility] [address] [place], [place] [phone]   [facility] Health Department  [phone]   [facility] Health Department  [phone]   [facility] Health Department  [phone]    Behavioral Health Resources in the Community: Intensive Outpatient Programs Organization         Address  Phone  Notes  [facility] Services [address] [place], [place] [phone]   [facility] Outpatient [address], [place], [place] [phone]   [facility] [address], [place], [place]  [phone]   [facility] [address]  [place], [place] [phone] or [phone]   Substance Abuse Resources Organization         Address  Phone  Notes  [facility] Drug Services  [phone]   [facility]  [phone]   [facility] [place]  [phone]   Floydene Flock  [phone]   Residential & Outpatient Substance Abuse Program  [phone]   Psychological Services Organization         Address  Phone  Notes  [facility]  [phone]   [facility]  [phone]   [facility] [address], [place] [phone] or [phone]    Mobile Crisis Teams Organization         Address  Phone  Notes  [facility], Mobile Crisis Care Unit  [phone]   [facility]  [address] [place], [place] [phone]   [facility] [address] [place] [phone]    Self-Help/Support Groups Organization         Address  Phone             Notes  [facility] [place] - variety of support groups   336- I7437963 Call for more information  Narcotics Anonymous (NA), [facility] [address], [employer] [place]  2 meetings at this location   Statistician         Address  Phone  Notes  [facility] [address],    [place]  [phone]   [facility]  [address] [place] [address], [place], [place] [phone]   [facility] [address] [place], [place] [phone] Admissions: 8am-3pm M-F  [facility] [address],    [place], [place] [phone]   [facility] [address], [place], [place] [phone]   [facility] [address]  [place], [place] [phone]   [facility] - Intensive Outpatient [address], [place], [place] [phone]   [facility] [address]  [place], [place] [phone] or [phone]   [facility] ([facility]) [address], [place], [place] [phone] Accepts Medicaid  [facility] [address]  [place] [phone] Substance Abuse/Addiction Treatment   [facility] Organization         Address  Phone  Notes  [facility]  [phone]   Angie Fava, PhD [address]vin [address] [place], [place]   [phone] or [phone]   [facility] Behavioral   [address] [place], [place] [phone]   [facility] [address] [place], [place] [phone] Insurance/Medicaid/sponsorship through [facility] [address]                                    [place], [place] [phone] Therapy/tele-psych/case  [facility] [address] [place] [address] [place], [place] [phone]    Dr. Lolly Mustache  [phone]   [facility] [place]  [employer] [facility] Dept. 1) [address], [place] 2) [address], [place] 3)  [address], [place] [phone]  [phone]   [facility] Child Abuse Hotline 845-249-1795 or [phone] (After Hours)

## 2014-08-03 ENCOUNTER — Encounter (HOSPITAL_BASED_OUTPATIENT_CLINIC_OR_DEPARTMENT_OTHER): Payer: Self-pay | Admitting: *Deleted

## 2014-08-03 ENCOUNTER — Emergency Department (HOSPITAL_BASED_OUTPATIENT_CLINIC_OR_DEPARTMENT_OTHER)
Admission: EM | Admit: 2014-08-03 | Discharge: 2014-08-03 | Disposition: A | Discharge status: Home / Self Care | Payer: BLUE CROSS/BLUE SHIELD | Attending: Emergency Medicine | Admitting: Emergency Medicine

## 2014-08-03 ENCOUNTER — Emergency Department (HOSPITAL_BASED_OUTPATIENT_CLINIC_OR_DEPARTMENT_OTHER): Payer: BLUE CROSS/BLUE SHIELD

## 2014-08-03 DIAGNOSIS — R0602 Shortness of breath: Secondary | ICD-10-CM | POA: Diagnosis present

## 2014-08-03 DIAGNOSIS — F329 Major depressive disorder, single episode, unspecified: Secondary | ICD-10-CM | POA: Insufficient documentation

## 2014-08-03 DIAGNOSIS — R059 Cough, unspecified: Secondary | ICD-10-CM

## 2014-08-03 DIAGNOSIS — J069 Acute upper respiratory infection, unspecified: Secondary | ICD-10-CM | POA: Insufficient documentation

## 2014-08-03 DIAGNOSIS — J9801 Acute bronchospasm: Secondary | ICD-10-CM

## 2014-08-03 DIAGNOSIS — J45901 Unspecified asthma with (acute) exacerbation: Secondary | ICD-10-CM | POA: Insufficient documentation

## 2014-08-03 DIAGNOSIS — E119 Type 2 diabetes mellitus without complications: Secondary | ICD-10-CM | POA: Diagnosis not present

## 2014-08-03 DIAGNOSIS — R05 Cough: Secondary | ICD-10-CM

## 2014-08-03 DIAGNOSIS — G4700 Insomnia, unspecified: Secondary | ICD-10-CM | POA: Diagnosis not present

## 2014-08-03 DIAGNOSIS — Z79899 Other long term (current) drug therapy: Secondary | ICD-10-CM | POA: Insufficient documentation

## 2014-08-03 MED ORDER — PREDNISONE 20 MG PO TABS: 60.0000 mg | ORAL_TABLET | Freq: Every day | ORAL | Status: DC

## 2014-08-03 MED ORDER — ALBUTEROL SULFATE HFA 108 (90 BASE) MCG/ACT IN AERS: 2.0000 | INHALATION_SPRAY | RESPIRATORY_TRACT | Status: DC | PRN

## 2014-08-03 MED ORDER — PREDNISONE 50 MG PO TABS
60.0000 mg | ORAL_TABLET | Freq: Once | ORAL | Status: AC
  Administered 2014-08-03: 60 mg via ORAL
  Filled 2014-08-03 (×2): qty 1

## 2014-08-03 MED ORDER — PREDNISONE 10 MG PO TABS
ORAL_TABLET | ORAL | Status: AC
  Filled 2014-08-03: qty 1

## 2014-08-03 NOTE — ED Notes (Signed)
Placed on cont cardiac & POX monitoring, position for comfort and to aid in breathing. Appears in no distress at this time, rec 5mg  Albuterol HHN tx by EMS prior to arrival to ED

## 2014-08-03 NOTE — Discharge Instructions (Signed)
Bronchospasm °A bronchospasm is a spasm or tightening of the airways going into the lungs. During a bronchospasm breathing becomes more difficult because the airways get smaller. When this happens there can be coughing, a whistling sound when breathing (wheezing), and difficulty breathing. Bronchospasm is often associated with asthma, but not all patients who experience a bronchospasm have asthma. °CAUSES  °A bronchospasm is caused by inflammation or irritation of the airways. The inflammation or irritation may be triggered by:  °· Allergies (such as to animals, pollen, food, or mold). Allergens that cause bronchospasm may cause wheezing immediately after exposure or many hours later.   °· Infection. Viral infections are believed to be the most common cause of bronchospasm.   °· Exercise.   °· Irritants (such as pollution, cigarette smoke, strong odors, aerosol sprays, and paint fumes).   °· Weather changes. Winds increase molds and pollens in the air. Rain refreshes the air by washing irritants out. Cold air may cause inflammation.   °· Stress and emotional upset.   °SIGNS AND SYMPTOMS  °· Wheezing.   °· Excessive nighttime coughing.   °· Frequent or severe coughing with a simple cold.   °· Chest tightness.   °· Shortness of breath.   °DIAGNOSIS  °Bronchospasm is usually diagnosed through a history and physical exam. Tests, such as chest X-rays, are sometimes done to look for other conditions. °TREATMENT  °· Inhaled medicines can be given to open up your airways and help you breathe. The medicines can be given using either an inhaler or a nebulizer machine. °· Corticosteroid medicines may be given for severe bronchospasm, usually when it is associated with asthma. °HOME CARE INSTRUCTIONS  °· Always have a plan prepared for seeking medical care. Know when to call your health care provider and local emergency services (911 in the [place] Know where you can access local emergency care. °· Only take medicines as  directed by your health care provider. °· If you were prescribed an inhaler or nebulizer machine, ask your health care provider to explain how to use it correctly. Always use a spacer with your inhaler if you were given one. °· It is necessary to remain calm during an attack. Try to relax and breathe more slowly.  °· Control your home environment in the following ways:   °· Change your heating and air conditioning filter at least once a month.   °· Limit your use of fireplaces and wood stoves. °· Do not smoke and do not allow smoking in your home.   °· Avoid exposure to perfumes and fragrances.   °· Get rid of pests (such as roaches and mice) and their droppings.   °· Throw away plants if you see mold on them.   °· Keep your house clean and dust free.   °· Replace carpet with wood, tile, or vinyl flooring. Carpet can trap dander and dust.   °· Use allergy-proof pillows, mattress covers, and box spring covers.   °· Wash bed sheets and blankets every week in hot water and dry them in a dryer.   °· Use blankets that are made of polyester or cotton.   °· Wash hands frequently. °SEEK MEDICAL CARE IF:  °· You have muscle aches.   °· You have chest pain.   °· The sputum changes from clear or white to yellow, green, gray, or bloody.   °· The sputum you cough up gets thicker.   °· There are problems that may be related to the medicine you are given, such as a rash, itching, swelling, or trouble breathing.   °SEEK IMMEDIATE MEDICAL CARE IF:  °· You have worsening wheezing and coughing even   after taking your prescribed medicines.   °· You have increased difficulty breathing.   °· You develop severe chest pain. °MAKE SURE YOU:  °· Understand these instructions. °· Will watch your condition. °· Will get help right away if you are not doing well or get worse. °Document Released: 02/10/2003 Document Revised: 02/12/2013 Document Reviewed: 07/30/2012 °ExitCare® Patient Information ©2015 [employer]. This information is not  intended to replace advice given to you by your health care provider. Make sure you discuss any questions you have with your health care provider. ° °Upper Respiratory Infection, Adult °An upper respiratory infection (URI) is also sometimes known as the common cold. The upper respiratory tract includes the nose, sinuses, throat, trachea, and bronchi. Bronchi are the airways leading to the lungs. Most people improve within 1 week, but symptoms can last up to 2 weeks. A residual cough may last even longer.  °CAUSES °Many different viruses can infect the tissues lining the upper respiratory tract. The tissues become irritated and inflamed and often become very moist. Mucus production is also common. A cold is contagious. You can easily spread the virus to others by oral contact. This includes kissing, sharing a glass, coughing, or sneezing. Touching your mouth or nose and then touching a surface, which is then touched by another person, can also spread the virus. °SYMPTOMS  °Symptoms typically develop 1 to 3 days after you come in contact with a cold virus. Symptoms vary from person to person. They may include: °· Runny nose. °· Sneezing. °· Nasal congestion. °· Sinus irritation. °· Sore throat. °· Loss of voice (laryngitis). °· Cough. °· Fatigue. °· Muscle aches. °· Loss of appetite. °· Headache. °· Low-grade fever. °DIAGNOSIS  °You might diagnose your own cold based on familiar symptoms, since most people get a cold 2 to 3 times a year. Your caregiver can confirm this based on your exam. Most importantly, your caregiver can check that your symptoms are not due to another disease such as strep throat, sinusitis, pneumonia, asthma, or epiglottitis. Blood tests, throat tests, and X-rays are not necessary to diagnose a common cold, but they may sometimes be helpful in excluding other more serious diseases. Your caregiver will decide if any further tests are required. °RISKS AND COMPLICATIONS  °You may be at risk for a  more severe case of the common cold if you smoke cigarettes, have chronic heart disease (such as heart failure) or lung disease (such as asthma), or if you have a weakened immune system. The very young and very old are also at risk for more serious infections. Bacterial sinusitis, middle ear infections, and bacterial pneumonia can complicate the common cold. The common cold can worsen asthma and chronic obstructive pulmonary disease (COPD). Sometimes, these complications can require emergency medical care and may be life-threatening. °PREVENTION  °The best way to protect against getting a cold is to practice good hygiene. Avoid oral or hand contact with people with cold symptoms. Wash your hands often if contact occurs. There is no clear evidence that vitamin C, vitamin E, echinacea, or exercise reduces the chance of developing a cold. However, it is always recommended to get plenty of rest and practice good nutrition. °TREATMENT  °Treatment is directed at relieving symptoms. There is no cure. Antibiotics are not effective, because the infection is caused by a virus, not by bacteria. Treatment may include: °· Increased fluid intake. Sports drinks offer valuable electrolytes, sugars, and fluids. °· Breathing heated mist or steam (vaporizer or shower). °· Eating chicken soup   or other clear broths, and maintaining good nutrition. °· Getting plenty of rest. °· Using gargles or lozenges for comfort. °· Controlling fevers with ibuprofen or acetaminophen as directed by your caregiver. °· Increasing usage of your inhaler if you have asthma. °Zinc gel and zinc lozenges, taken in the first 24 hours of the common cold, can shorten the duration and lessen the severity of symptoms. Pain medicines may help with fever, muscle aches, and throat pain. A variety of non-prescription medicines are available to treat congestion and runny nose. Your caregiver can make recommendations and may suggest nasal or lung inhalers for other  symptoms.  °HOME CARE INSTRUCTIONS  °· Only take over-the-counter or prescription medicines for pain, discomfort, or fever as directed by your caregiver. °· Use a warm mist humidifier or inhale steam from a shower to increase air moisture. This may keep secretions moist and make it easier to breathe. °· Drink enough water and fluids to keep your urine clear or pale yellow. °· Rest as needed. °· Return to work when your temperature has returned to normal or as your caregiver advises. You may need to stay home longer to avoid infecting others. You can also use a face mask and careful hand washing to prevent spread of the virus. °SEEK MEDICAL CARE IF:  °· After the first few days, you feel you are getting worse rather than better. °· You need your caregiver's advice about medicines to control symptoms. °· You develop chills, worsening shortness of breath, or brown or red sputum. These may be signs of pneumonia. °· You develop yellow or brown nasal discharge or pain in the face, especially when you bend forward. These may be signs of sinusitis. °· You develop a fever, swollen neck glands, pain with swallowing, or white areas in the back of your throat. These may be signs of strep throat. °SEEK IMMEDIATE MEDICAL CARE IF:  °· You have a fever. °· You develop severe or persistent headache, ear pain, sinus pain, or chest pain. °· You develop wheezing, a prolonged cough, cough up blood, or have a change in your usual mucus (if you have chronic lung disease). °· You develop sore muscles or a stiff neck. °Document Released: 08/03/2000 Document Revised: 05/02/2011 Document Reviewed: 05/15/2013 °ExitCare® Patient Information ©2015 [employer]. This information is not intended to replace advice given to you by your health care provider. Make sure you discuss any questions you have with your health care provider. ° °

## 2014-08-03 NOTE — ED Notes (Signed)
MD at bedside. 

## 2014-08-03 NOTE — ED Provider Notes (Signed)
TIME SEEN: 10:20 AM  CHIEF COMPLAINT: Wheezing, cough  HPI: Pt is a 44 y.o. male with history of asthma who presents to the emergency department with one week of intermittent wheezing, cough without sputum production. No fever. Reports he ran out of his albuterol this week. States he had to get a nebulizer treatment by the fire department on Thursday, 4 days ago. Received albuterol treatment with EMS today and reports feeling much better. No history of PE or DVT. No history of CAD or CHF. No lower extremity swelling or pain. No recent prolonged immobilization such as long flight, hospitalization, fracture, surgery, trauma.  ROS: See HPI Constitutional: no fever  Eyes: no drainage  ENT: no runny nose   Cardiovascular:  no chest pain  Resp:  SOB  GI: no vomiting GU: no dysuria Integumentary: no rash  Allergy: no hives  Musculoskeletal: no leg swelling  Neurological: no slurred speech ROS otherwise negative  PAST MEDICAL HISTORY/PAST SURGICAL HISTORY:  Past Medical History  Diagnosis Date  . Depression   . Insomnia   . Diabetes mellitus without complication     MEDICATIONS:  Prior to Admission medications   Medication Sig Start Date End Date Taking? Authorizing Provider  FLUoxetine (PROZAC) 20 MG tablet Take 20 mg by mouth daily.    Historical Provider, MD  traZODone (DESYREL) 100 MG tablet Take 100 mg by mouth at bedtime as needed for sleep.    Historical Provider, MD    ALLERGIES:  No Known Allergies  SOCIAL HISTORY:  History  Substance Use Topics  . Smoking status: Never Smoker   . Smokeless tobacco: Never Used  . Alcohol Use: No    FAMILY HISTORY: Family History  Problem Relation Age of Onset  . Cancer Mother   . Cancer Father     EXAM: BP 103/58 mmHg  Pulse 81  Resp 24  Ht 5\' 9"  (1.753 m)  Wt 156 lb (70.761 kg)  BMI 23.03 kg/m2  SpO2 93% CONSTITUTIONAL: Alert and oriented and responds appropriately to questions. Well-appearing; well-nourished HEAD:  Normocephalic EYES: Conjunctivae clear, PERRL ENT: normal nose; no rhinorrhea; moist mucous membranes; pharynx without lesions noted NECK: Supple, no meningismus, no LAD  CARD: RRR; S1 and S2 appreciated; no murmurs, no clicks, no rubs, no gallops RESP: Normal chest excursion without splinting or tachypnea; breath sounds clear and equal bilaterally; no wheezes, no rhonchi, no rales, no hypoxia or respiratory distress, speaking full sentences ABD/GI: Normal bowel sounds; non-distended; soft, non-tender, no rebound, no guarding, no peritoneal signs BACK:  The back appears normal and is non-tender to palpation, there is no CVA tenderness EXT: Normal ROM in all joints; non-tender to palpation; no edema; normal capillary refill; no cyanosis, no calf tenderness or swelling    SKIN: Normal color for age and race; warm NEURO: Moves all extremities equally, sensation to light touch intact diffusely, cranial nerves II through XII intact PSYCH: The patient's mood and manner are appropriate. Grooming and personal hygiene are appropriate.  MEDICAL DECISION MAKING: Patient here with acute bronchospasm. Lungs now clear after nebulizer treatment with EMS. He is now asymptomatic. Chest x-ray clear. We'll discharge on prednisone and refill his albuterol inhaler. Discussed return precautions and supportive care instructions. He verbalizes understanding and is comfortable with plan.      Layla Maw Ved Martos, DO 08/03/14 1604

## 2014-08-03 NOTE — ED Notes (Signed)
States has had SOB/ wheezing x 1 week, ran out of inhaler, states also went to [facility] last week and rec tx also.

## 2014-08-03 NOTE — ED Notes (Signed)
Discharge instructions given and reviewed with patient.  Patient verbalized understanding to take medications as directed and to follow up with community resources if needed.  Patient ambulatory; discharged home in good condition.

## 2015-01-26 ENCOUNTER — Encounter (HOSPITAL_COMMUNITY): Payer: Self-pay | Admitting: *Deleted

## 2015-01-26 ENCOUNTER — Emergency Department (HOSPITAL_COMMUNITY)
Admission: EM | Admit: 2015-01-26 | Discharge: 2015-01-26 | Disposition: A | Discharge status: Home / Self Care | Payer: BLUE CROSS/BLUE SHIELD | Attending: Emergency Medicine | Admitting: Emergency Medicine

## 2015-01-26 DIAGNOSIS — Z79899 Other long term (current) drug therapy: Secondary | ICD-10-CM | POA: Diagnosis not present

## 2015-01-26 DIAGNOSIS — E119 Type 2 diabetes mellitus without complications: Secondary | ICD-10-CM | POA: Insufficient documentation

## 2015-01-26 DIAGNOSIS — Z7952 Long term (current) use of systemic steroids: Secondary | ICD-10-CM | POA: Insufficient documentation

## 2015-01-26 DIAGNOSIS — F329 Major depressive disorder, single episode, unspecified: Secondary | ICD-10-CM | POA: Diagnosis not present

## 2015-01-26 DIAGNOSIS — L0291 Cutaneous abscess, unspecified: Secondary | ICD-10-CM

## 2015-01-26 DIAGNOSIS — L02414 Cutaneous abscess of left upper limb: Secondary | ICD-10-CM | POA: Insufficient documentation

## 2015-01-26 MED ORDER — SULFAMETHOXAZOLE-TRIMETHOPRIM 800-160 MG PO TABS: 1.0000 | ORAL_TABLET | Freq: Two times a day (BID) | ORAL | Status: AC

## 2015-01-26 MED ORDER — LIDOCAINE HCL (PF) 1 % IJ SOLN
5.0000 mL | Freq: Once | INTRAMUSCULAR | Status: AC
  Administered 2015-01-26: 5 mL
  Filled 2015-01-26: qty 5

## 2015-01-26 NOTE — ED Provider Notes (Signed)
CSN: [id number]     Arrival date & time 01/26/15  1636 History  By signing my name below, I, Devin [name], attest that this documentation has been prepared under the direction and in the presence of Newell RubbermaidJeffrey Madison Direnzo, PA-C. Electronically Signed: Gonzella LexKimberly Bianca [name], Scribe. 01/26/2015. 8:09 PM.    Chief Complaint  Patient presents with  . Abscess    The history is provided by the patient. No language interpreter was used.    HPI Comments: Devin [name] is a 44 y.o. male with a h/o previous abscesses who presents to the Emergency Department complaining of a cyst on his left hand onset one week ago. Pt notes that his past sists have been treated with I&D procedures after being numbed. 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 He denies fever, chills, nausea, and vomiting . Pt has a medical h/o DM but states he is no longer diabetic. He has not checked his blood glucose in a month or two. Pt reports he is healthy otherwise. No loss of distal sensation strength or motor function of the affected extremity.  Past Medical History  Diagnosis Date  . Depression   . Insomnia   . Diabetes mellitus without complication Devin [name](HCC)    Past Surgical History  Procedure Laterality Date  . Eye surgery      Cataract removed   Family History  Problem Relation Age of Onset  . Cancer Mother   . Cancer Father    Social History  Substance Use Topics  . Smoking status: Never Smoker   . Smokeless tobacco: Never Used  . Alcohol Use: No    Review of Systems  All other systems reviewed and are negative.  Allergies  Review of patient's allergies indicates no known allergies.  Home Medications   Prior to Admission medications   Medication Sig Start Date End Date Taking? Authorizing Provider  albuterol (PROVENTIL HFA;VENTOLIN HFA) 108 (90 BASE) MCG/ACT inhaler Inhale 2 puffs into the lungs every 4 (four) hours as needed for wheezing or shortness of breath. 08/03/14   Kristen N Ward, DO   FLUoxetine (PROZAC) 20 MG tablet Take 20 mg by mouth daily.    Historical Provider, MD  predniSONE (DELTASONE) 20 MG tablet Take 3 tablets (60 mg total) by mouth daily. 08/03/14   Kristen N Ward, DO  sulfamethoxazole-trimethoprim (BACTRIM DS,SEPTRA DS) 800-160 MG tablet Take 1 tablet by mouth 2 (two) times daily. 01/26/15 02/02/15  Eyvonne MechanicJeffrey Latha Staunton, PA-C  traZODone (DESYREL) 100 MG tablet Take 100 mg by mouth at bedtime as needed for sleep.    Historical Provider, MD   BP 104/65 mmHg  Pulse 70  Temp(Src) 97.2 F (36.2 C) (Oral)  Resp 18  SpO2 97%   Physical Exam  Constitutional: He is oriented to person, place, and time. He appears well-developed and well-nourished. No distress.  HENT:  Head: Normocephalic.  Eyes: Conjunctivae are normal.  Cardiovascular: Normal rate.   Pulmonary/Chest: Effort normal.  Abdominal: He exhibits no distension.  Musculoskeletal:  1 cm abscess to the left lateral aspect of the wrist, minimal surrounding redness. Distal sensation strength or motor function intact, radial and ulnar pulses 2+, Refill less than 3 seconds  Neurological: He is alert and oriented to person, place, and time.  Skin: Skin is warm and dry.  Psychiatric: He has a normal mood and affect.  Nursing note and vitals reviewed.   ED Course  Procedures  DIAGNOSTIC STUDIES:    Oxygen Saturation is 99% on RA, normal by  my interpretation.   COORDINATION OF CARE:  7:48 PM Will perform I&D procedure. Discussed treatment plan with pt at bedside and pt agreed to plan.   INCISION AND DRAINAGE PROCEDURE NOTE: Patient identification was confirmed and verbal consent was obtained. This procedure was performed by Eyvonne Mechanic, PA-C at 7:48 PM. Site: left lateral wrist Sterile procedures observed: I&D Needle size: 25 gauge Anesthetic used (type and amt): Lidocaine 1% 3mL Blade size: 25 Drainage: 2 cc purulent discharge Complexity: Complex Packing used: none  Site anesthetized, incision  made over site, wound drained and explored loculations, rinsed with copious amounts of normal saline, wound packed with sterile gauze, covered with dry, sterile dressing.  Pt tolerated procedure well without complications.  Instructions for care discussed verbally and pt provided with additional written instructions for homecare and f/u.  MDM   Final diagnoses:  Abscess     Labs:  Imaging:  Consults:  Therapeutics: I&D procedure performed with lidocaine 1% injection 3 mL  Discharge Meds: sulfamethoxazole-trimethoprim 800-160 MG tablet 2 times daily  Assessment/plan: Patient presents with an abscess that was I&D here with no complications. He does have some surrounding redness will be treated with above antibiotics. Patient is instructed to return in 2-3 days for reevaluation of wound, sooner if any new or worsening signs or symptoms present. Patient verbalized understanding and agreement today's plan, should his follow-up evaluation.   I personally performed the services described in this documentation, which was scribed in my presence. The recorded information has been reviewed and is accurate.      Eyvonne Mechanic, PA-C 01/27/15 1610  Pricilla Loveless, MD 01/31/15 [phone]

## 2015-01-26 NOTE — Discharge Instructions (Signed)
Abscess An abscess is an infected area that contains a collection of pus and debris.It can occur in almost any part of the body. An abscess is also known as a furuncle or boil. CAUSES  An abscess occurs when tissue gets infected. This can occur from blockage of oil or sweat glands, infection of hair follicles, or a minor injury to the skin. As the body tries to fight the infection, pus collects in the area and creates pressure under the skin. This pressure causes pain. People with weakened immune systems have difficulty fighting infections and get certain abscesses more often.  SYMPTOMS Usually an abscess develops on the skin and becomes a painful mass that is red, warm, and tender. If the abscess forms under the skin, you may feel a moveable soft area under the skin. Some abscesses break open (rupture) on their own, but most will continue to get worse without care. The infection can spread deeper into the body and eventually into the bloodstream, causing you to feel ill.  DIAGNOSIS  Your caregiver will take your medical history and perform a physical exam. A sample of fluid may also be taken from the abscess to determine what is causing your infection. TREATMENT  Your caregiver may prescribe antibiotic medicines to fight the infection. However, taking antibiotics alone usually does not cure an abscess. Your caregiver may need to make a small cut (incision) in the abscess to drain the pus. In some cases, gauze is packed into the abscess to reduce pain and to continue draining the area. HOME CARE INSTRUCTIONS   Only take over-the-counter or prescription medicines for pain, discomfort, or fever as directed by your caregiver.  If you were prescribed antibiotics, take them as directed. Finish them even if you start to feel better.  If gauze is used, follow your caregiver's directions for changing the gauze.  To avoid spreading the infection:  Keep your draining abscess covered with a  bandage.  Wash your hands well.  Do not share personal care items, towels, or whirlpools with others.  Avoid skin contact with others.  Keep your skin and clothes clean around the abscess.  Keep all follow-up appointments as directed by your caregiver. SEEK MEDICAL CARE IF:   You have increased pain, swelling, redness, fluid drainage, or bleeding.  You have muscle aches, chills, or a general ill feeling.  You have a fever. MAKE SURE YOU:   Understand these instructions.  Will watch your condition.  Will get help right away if you are not doing well or get worse.   This information is not intended to replace advice given to you by your health care provider. Make sure you discuss any questions you have with your health care provider.   Document Released: 11/17/2004 Document Revised: 08/09/2011 Document Reviewed: 04/22/2011 Elsevier Interactive Patient Education 2016 [employer].  Please monitor for new or worsening signs or symptoms, if any present please return to the emergency room immediately for further evaluation and management. Please follow-up with health provider in 2-3 days for reevaluation and was checked.

## 2015-01-26 NOTE — ED Notes (Signed)
Left wrist abcess drained by PA.  I wrapped with 2" kling

## 2015-01-26 NOTE — ED Notes (Signed)
Pt has abscess to left wrist x 2 days. Now has redness, warmth and swelling to hand. Clean bandage applied at triage.

## 2015-09-20 ENCOUNTER — Emergency Department (HOSPITAL_COMMUNITY)
Admission: EM | Admit: 2015-09-20 | Discharge: 2015-09-21 | Disposition: A | Discharge status: Other Acute Inpatient Hospital | Payer: BLUE CROSS/BLUE SHIELD | Attending: Emergency Medicine | Admitting: Emergency Medicine

## 2015-09-20 ENCOUNTER — Encounter (HOSPITAL_COMMUNITY): Payer: Self-pay | Admitting: Emergency Medicine

## 2015-09-20 DIAGNOSIS — J45909 Unspecified asthma, uncomplicated: Secondary | ICD-10-CM | POA: Diagnosis not present

## 2015-09-20 DIAGNOSIS — F331 Major depressive disorder, recurrent, moderate: Secondary | ICD-10-CM | POA: Diagnosis not present

## 2015-09-20 DIAGNOSIS — Z79899 Other long term (current) drug therapy: Secondary | ICD-10-CM | POA: Insufficient documentation

## 2015-09-20 DIAGNOSIS — E119 Type 2 diabetes mellitus without complications: Secondary | ICD-10-CM | POA: Insufficient documentation

## 2015-09-20 DIAGNOSIS — F339 Major depressive disorder, recurrent, unspecified: Secondary | ICD-10-CM

## 2015-09-20 DIAGNOSIS — R45851 Suicidal ideations: Secondary | ICD-10-CM | POA: Diagnosis present

## 2015-09-20 HISTORY — DX: Unspecified asthma, uncomplicated: J45.909
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
## 2015-09-21 ENCOUNTER — Observation Stay (HOSPITAL_COMMUNITY)
Admission: AD | Admit: 2015-09-21 | Discharge: 2015-09-22 | Disposition: A | Discharge status: Home / Self Care | Payer: BLUE CROSS/BLUE SHIELD | Source: Intra-hospital | Attending: Psychiatry | Admitting: Psychiatry

## 2015-09-21 ENCOUNTER — Encounter (HOSPITAL_COMMUNITY): Payer: Self-pay | Admitting: *Deleted

## 2015-09-21 DIAGNOSIS — Z590 Homelessness: Secondary | ICD-10-CM | POA: Insufficient documentation

## 2015-09-21 DIAGNOSIS — R45851 Suicidal ideations: Secondary | ICD-10-CM | POA: Insufficient documentation

## 2015-09-21 DIAGNOSIS — F4000 Agoraphobia, unspecified: Secondary | ICD-10-CM | POA: Insufficient documentation

## 2015-09-21 DIAGNOSIS — G4700 Insomnia, unspecified: Secondary | ICD-10-CM | POA: Insufficient documentation

## 2015-09-21 DIAGNOSIS — Z9119 Patient's noncompliance with other medical treatment and regimen: Secondary | ICD-10-CM | POA: Diagnosis not present

## 2015-09-21 DIAGNOSIS — J45909 Unspecified asthma, uncomplicated: Secondary | ICD-10-CM | POA: Diagnosis not present

## 2015-09-21 DIAGNOSIS — F331 Major depressive disorder, recurrent, moderate: Principal | ICD-10-CM | POA: Diagnosis present

## 2015-09-21 DIAGNOSIS — R4584 Anhedonia: Secondary | ICD-10-CM | POA: Insufficient documentation

## 2015-09-21 DIAGNOSIS — E119 Type 2 diabetes mellitus without complications: Secondary | ICD-10-CM | POA: Diagnosis not present

## 2015-09-21 DIAGNOSIS — F339 Major depressive disorder, recurrent, unspecified: Secondary | ICD-10-CM

## 2015-09-21 LAB — COMPREHENSIVE METABOLIC PANEL
ALT: 16 U/L — ABNORMAL LOW (ref 17–63)
AST: 16 U/L (ref 15–41)
Albumin: 4.2 g/dL (ref 3.5–5.0)
Alkaline Phosphatase: 50 U/L (ref 38–126)
Anion gap: 6 (ref 5–15)
BUN: 10 mg/dL (ref 6–20)
CHLORIDE: 103 mmol/L (ref 101–111)
CO2: 27 mmol/L (ref 22–32)
CREATININE: 1.14 mg/dL (ref 0.61–1.24)
Calcium: 9 mg/dL (ref 8.9–10.3)
Glucose, Bld: 197 mg/dL — ABNORMAL HIGH (ref 65–99)
POTASSIUM: 3.9 mmol/L (ref 3.5–5.1)
SODIUM: 136 mmol/L (ref 135–145)
Total Bilirubin: 0.6 mg/dL (ref 0.3–1.2)
Total Protein: 7.3 g/dL (ref 6.5–8.1)

## 2015-09-21 LAB — ACETAMINOPHEN LEVEL

## 2015-09-21 LAB — CBC
HCT: 49.9 % (ref 39.0–52.0)
Hemoglobin: 16.5 g/dL (ref 13.0–17.0)
MCH: 30.4 pg (ref 26.0–34.0)
MCHC: 33.1 g/dL (ref 30.0–36.0)
MCV: 91.9 fL (ref 78.0–100.0)
PLATELETS: 232 10*3/uL (ref 150–400)
RBC: 5.43 MIL/uL (ref 4.22–5.81)
RDW: 13.6 % (ref 11.5–15.5)
WBC: 11.2 10*3/uL — AB (ref 4.0–10.5)

## 2015-09-21 LAB — RAPID URINE DRUG SCREEN, HOSP PERFORMED
Amphetamines: NOT DETECTED
Barbiturates: NOT DETECTED
Benzodiazepines: NOT DETECTED
COCAINE: NOT DETECTED
Opiates: NOT DETECTED
Tetrahydrocannabinol: NOT DETECTED

## 2015-09-21 LAB — SALICYLATE LEVEL

## 2015-09-21 LAB — ETHANOL

## 2015-09-21 MED ORDER — ESCITALOPRAM OXALATE 10 MG PO TABS
20.0000 mg | ORAL_TABLET | Freq: Every day | ORAL | Status: DC
  Administered 2015-09-22: 20 mg via ORAL
  Filled 2015-09-21: qty 2

## 2015-09-21 MED ORDER — ALUM & MAG HYDROXIDE-SIMETH 200-200-20 MG/5ML PO SUSP: 30.0000 mL | ORAL | Status: DC | PRN

## 2015-09-21 MED ORDER — ESCITALOPRAM OXALATE 10 MG PO TABS
20.0000 mg | ORAL_TABLET | Freq: Every day | ORAL | Status: DC
  Administered 2015-09-21: 20 mg via ORAL
  Filled 2015-09-21: qty 2

## 2015-09-21 MED ORDER — TRAZODONE HCL 100 MG PO TABS
100.0000 mg | ORAL_TABLET | Freq: Every evening | ORAL | Status: DC | PRN
  Administered 2015-09-21: 100 mg via ORAL
  Filled 2015-09-21: qty 1

## 2015-09-21 MED ORDER — TRAZODONE HCL 100 MG PO TABS
100.0000 mg | ORAL_TABLET | Freq: Every evening | ORAL | Status: DC | PRN
Start: 2015-09-21 — End: 2015-09-21

## 2015-09-21 MED ORDER — ALBUTEROL SULFATE HFA 108 (90 BASE) MCG/ACT IN AERS: 2.0000 | INHALATION_SPRAY | RESPIRATORY_TRACT | Status: DC | PRN

## 2015-09-21 MED ORDER — MAGNESIUM HYDROXIDE 400 MG/5ML PO SUSP: 30.0000 mL | Freq: Every day | ORAL | Status: DC | PRN

## 2015-09-21 MED ORDER — ACETAMINOPHEN 325 MG PO TABS: 650.0000 mg | ORAL_TABLET | Freq: Four times a day (QID) | ORAL | Status: DC | PRN
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
## 2015-09-21 NOTE — ED Notes (Signed)
Pt remained calm and cooperative, staying in bed most of the day. He was transported to [facility] Observation Unit by [facility]. All belongings returned to pt who signed for same.

## 2015-09-21 NOTE — ED Notes (Signed)
Patient noted sleeping in room. No complaints, stable, in no acute distress. Q15 minute rounds and monitoring via [employer] Cam[employer] to continue.  
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
## 2015-09-21 NOTE — BH Assessment (Signed)
Assessment completed. Consulted Jamison Lord, NP who recommended inpatient treatment. TTS to seek placement.  

## 2015-09-21 NOTE — ED Notes (Signed)
Pt. Transferred to [facility] from ED to room 36. Report to include Situation, Background, Assessment and Recommendations from [facility]. Pt. Oriented to unit including Q15 minute rounds as well as the security cameras for their protection. Patient is alert and oriented, warm and dry in no acute distress. Patient denies  HI, and AVH. Pt. States he has SI without a plan. Pt. Encouraged to let me know if needs arise.

## 2015-09-21 NOTE — Consult Note (Signed)
[facility] Consult   Reason for Consult:  Depression, anxiety Referring Physician:  EDP Patient Identification: Devin [name] MRN:  [id number] Principal Diagnosis: Major depressive disorder, recurrent episode, moderate ([place]) Diagnosis:   Patient Active Problem List   Diagnosis Date Noted  . Major depressive disorder, recurrent episode, moderate ([place]) [F33.1] 09/21/2015    Priority: High    Total Time spent with patient: 45 minutes  Subjective:   Gregery Walberg is a 45 y.o. male patient admitted to [facility] Observation Unit.  HPI:  45 yo male who presented to the ED for increase in stressors and  Depression.  Patient is limited intellectually with learning disabilities.  He has been stressed with paying bills on his part-time income but does live rent free in his deceased mother's house.  No suicidal/homicidal ideations, hallucinations, and alcohol/drug abuse.  He appears to need someone to help him manage his money and pay his bills as he makes close to $350 per week.  Pleasant and cooperative, request inpatient treatment.  Past Psychiatric History: depression  Risk to Self: Suicidal Ideation: Yes-Currently Present Suicidal Intent: No Is patient at risk for suicide?: Yes Suicidal Plan?: No Access to Means: No What has been your use of drugs/alcohol within the last 12 months?: Denies  How many times?: 0 Other Self Harm Risks: Denies Triggers for Past Attempts: None known (No previous attempts reported. ) Intentional Self Injurious Behavior: None Risk to Others: Homicidal Ideation: No Thoughts of Harm to Others: No Current Homicidal Intent: No Current Homicidal Plan: No Access to Homicidal Means: No Identified Victim: N/A History of harm to others?: No Assessment of Violence: None Noted Violent Behavior Description: No violent behavior observed.  Does patient have access to weapons?: No Criminal Charges Pending?: No Does patient have a court date:  No Prior Inpatient Therapy: Prior Inpatient Therapy: No Prior Therapy Dates: N/A Prior Therapy Facilty/Provider(s): N/A Reason for Treatment: N/A Prior Outpatient Therapy: Prior Outpatient Therapy: No Prior Therapy Dates: N/A Prior Therapy Facilty/Provider(s): N/A Reason for Treatment: N/A Does patient have an ACCT team?: No Does patient have Intensive In-House Services?  : No Does patient have [facility] services? : Yes Does patient have P4CC services?: No  Past Medical History:  Past Medical History:  Diagnosis Date  . Asthma   . Depression   . Diabetes mellitus without complication ([place])   . Insomnia     Past Surgical History:  Procedure Laterality Date  . EYE SURGERY     Cataract removed  . TONSILLECTOMY     Family History:  Family History  Problem Relation Age of Onset  . Cancer Mother   . Cancer Father    Family Psychiatric  History: unknown, adopted Social History:  History  Alcohol Use No     History  Drug Use No    Social History   Social History  . Marital status: Single    Spouse name: N/A  . Number of children: N/A  . Years of education: N/A   Social History Main Topics  . Smoking status: Never Smoker  . Smokeless tobacco: Never Used  . Alcohol use No  . Drug use: No  . Sexual activity: Not Asked   Other Topics Concern  . None   Social History Narrative  . None   Additional Social History:    Allergies:  No Known Allergies  Labs:  Results for orders placed or performed during the hospital encounter of 09/20/15 (from the past 48 hour(s))  Comprehensive metabolic panel  Status: Abnormal   Collection Time: 09/20/15 11:46 PM  Result Value Ref Range   Sodium 136 135 - 145 mmol/L   Potassium 3.9 3.5 - 5.1 mmol/L   Chloride 103 101 - 111 mmol/L   CO2 27 22 - 32 mmol/L   Glucose, Bld 197 (H) 65 - 99 mg/dL   BUN 10 6 - 20 mg/dL   Creatinine, Ser 1.14 0.61 - 1.24 mg/dL   Calcium 9.0 8.9 - 10.3 mg/dL   Total Protein 7.3 6.5 - 8.1 g/dL    Albumin 4.2 3.5 - 5.0 g/dL   AST 16 15 - 41 U/L   ALT 16 (L) 17 - 63 U/L   Alkaline Phosphatase 50 38 - 126 U/L   Total Bilirubin 0.6 0.3 - 1.2 mg/dL    Comment: REPEATED TO VERIFY   GFR calc non Af Amer >60 >60 mL/min   GFR calc Af Amer >60 >60 mL/min    Comment: (NOTE) The eGFR has been calculated using the CKD EPI equation. This calculation has not been validated in all clinical situations. eGFR's persistently <60 mL/min signify possible Chronic Kidney Disease.    Anion gap 6 5 - 15  Ethanol     Status: None   Collection Time: 09/20/15 11:46 PM  Result Value Ref Range   Alcohol, Ethyl (B) <5 <5 mg/dL    Comment:        LOWEST DETECTABLE LIMIT FOR SERUM ALCOHOL IS 5 mg/dL FOR MEDICAL PURPOSES ONLY   Salicylate level     Status: None   Collection Time: 09/20/15 11:46 PM  Result Value Ref Range   Salicylate Lvl <2.2 2.8 - 30.0 mg/dL  Acetaminophen level     Status: Abnormal   Collection Time: 09/20/15 11:46 PM  Result Value Ref Range   Acetaminophen (Tylenol), Serum <10 (L) 10 - 30 ug/mL    Comment:        THERAPEUTIC CONCENTRATIONS VARY SIGNIFICANTLY. A RANGE OF 10-30 ug/mL MAY BE AN EFFECTIVE CONCENTRATION FOR MANY PATIENTS. HOWEVER, SOME ARE BEST TREATED AT CONCENTRATIONS OUTSIDE THIS RANGE. ACETAMINOPHEN CONCENTRATIONS >150 ug/mL AT 4 HOURS AFTER INGESTION AND >50 ug/mL AT 12 HOURS AFTER INGESTION ARE OFTEN ASSOCIATED WITH TOXIC REACTIONS.   cbc     Status: Abnormal   Collection Time: 09/20/15 11:46 PM  Result Value Ref Range   WBC 11.2 (H) 4.0 - 10.5 K/uL   RBC 5.43 4.22 - 5.81 MIL/uL   Hemoglobin 16.5 13.0 - 17.0 g/dL   HCT 49.9 39.0 - 52.0 %   MCV 91.9 78.0 - 100.0 fL   MCH 30.4 26.0 - 34.0 pg   MCHC 33.1 30.0 - 36.0 g/dL   RDW 13.6 11.5 - 15.5 %   Platelets 232 150 - 400 K/uL  Rapid urine drug screen (hospital performed)     Status: None   Collection Time: 09/20/15 11:50 PM  Result Value Ref Range   Opiates NONE DETECTED NONE DETECTED    Cocaine NONE DETECTED NONE DETECTED   Benzodiazepines NONE DETECTED NONE DETECTED   Amphetamines NONE DETECTED NONE DETECTED   Tetrahydrocannabinol NONE DETECTED NONE DETECTED   Barbiturates NONE DETECTED NONE DETECTED    Comment:        DRUG SCREEN FOR MEDICAL PURPOSES ONLY.  IF CONFIRMATION IS NEEDED FOR ANY PURPOSE, NOTIFY LAB WITHIN 5 DAYS.        LOWEST DETECTABLE LIMITS FOR URINE DRUG SCREEN Drug Class       Cutoff (ng/mL) Amphetamine  1000 Barbiturate      200 Benzodiazepine   423 Tricyclics       536 Opiates          300 Cocaine          300 THC              50     Current Facility-Administered Medications  Medication Dose Route Frequency Provider Last Rate Last Dose  . albuterol (PROVENTIL HFA;VENTOLIN HFA) 108 (90 Base) MCG/ACT inhaler 2 puff  2 puff Inhalation Q4H PRN Junius Creamer, NP      . escitalopram (LEXAPRO) tablet 20 mg  20 mg Oral Daily Junius Creamer, NP   20 mg at 09/21/15 0946  . traZODone (DESYREL) tablet 100 mg  100 mg Oral QHS PRN Junius Creamer, NP       Current Outpatient Prescriptions  Medication Sig Dispense Refill  . albuterol (PROVENTIL HFA;VENTOLIN HFA) 108 (90 BASE) MCG/ACT inhaler Inhale 2 puffs into the lungs every 4 (four) hours as needed for wheezing or shortness of breath. 1 Inhaler 0  . escitalopram (LEXAPRO) 20 MG tablet Take 20 mg by mouth daily.    . traZODone (DESYREL) 100 MG tablet Take 100 mg by mouth at bedtime as needed for sleep.      Musculoskeletal: Strength & Muscle Tone: within normal limits Gait & Station: normal Patient leans: N/A  Psychiatric Specialty Exam: Physical Exam  Constitutional: He is oriented to person, place, and time. He appears well-developed and well-nourished.  HENT:  Head: Normocephalic.  Neck: Normal range of motion.  Respiratory: Effort normal.  Musculoskeletal: Normal range of motion.  Neurological: He is alert and oriented to person, place, and time.  Skin: Skin is warm.  Psychiatric: His  speech is normal and behavior is normal. Judgment and thought content normal. His mood appears anxious. Cognition and memory are normal. He exhibits a depressed mood.    Review of Systems  Constitutional: Negative.   HENT: Negative.   Eyes: Negative.   Respiratory: Negative.   Cardiovascular: Negative.   Gastrointestinal: Negative.   Genitourinary: Negative.   Musculoskeletal: Negative.   Skin: Negative.   Neurological: Negative.   Endo/Heme/Allergies: Negative.   Psychiatric/Behavioral: Positive for depression. The patient is nervous/anxious.     Blood pressure 108/76, pulse (!) 59, temperature 97.5 F (36.4 C), temperature source Oral, resp. rate 17, weight 78.9 kg (174 lb), SpO2 98 %.Body mass index is 25.7 kg/m.  General Appearance: Casual  Eye Contact:  Fair  Speech:  Normal Rate  Volume:  Normal  Mood:  Anxious and Depressed  Affect:  Congruent  Thought Process:  Coherent and Descriptions of Associations: Intact  Orientation:  Full (Time, Place, and Person)  Thought Content:  WDL  Suicidal Thoughts:  No  Homicidal Thoughts:  No  Memory:  Immediate;   Fair Recent;   Fair Remote;   Fair  Judgement:  Fair  Insight:  Fair  Psychomotor Activity:  Decreased  Concentration:  Concentration: Fair and Attention Span: Fair  Recall:  [employer] of Knowledge:  Fair  Language:  Good  Akathisia:  No  Handed:  Right  AIMS (if indicated):     Assets:  Leisure Time Physical Health Resilience  ADL's:  Intact  Cognition:  WNL  Sleep:        Treatment Plan Summary: Daily contact with patient to assess and evaluate symptoms and progress in treatment, Medication management and Plan major depressive disorder, recurrent, moderate:  -Crisis stabilization -Medication  management:  Continued medical medications along with his Lexapro 20 mg daily for depression and Trazodone 100 mg at bedtime for sleep -Individual counseling  Disposition: Supportive therapy provided about ongoing  stressors.  Waylan Boga, NP 09/21/2015 11:24 AM  Patient seen face-to-face for psychiatric evaluation, chart reviewed and case discussed with the physician extender and developed treatment plan. Reviewed the information documented and agree with the treatment plan. Corena Pilgrim, MD

## 2015-09-21 NOTE — BH Assessment (Signed)
[facility] Assessment Progress Note  Per Thedore Mins, MD, this pt would benefit from admission to the [facility] Observation Unit at this time.  Lillia Abed, RN, [facility] has assigned pt to Obs 6.  Pt has signed Voluntary Admission and Consent for Treatment, as well as Consent to Release Information to Merlyn Albert, his outpatient provider at [facility] and a notification call has been placed.  Signed forms have been faxed to [facility].  Pt's nurse, Diane, has been notified, and agrees to send original paperwork along with pt via Juel Burrow, and to call report to [phone] or [phone].  Doylene Canning, MA Triage Specialist [phone]

## 2015-09-22 DIAGNOSIS — F331 Major depressive disorder, recurrent, moderate: Secondary | ICD-10-CM

## 2015-09-22 MED ORDER — ESCITALOPRAM OXALATE 20 MG PO TABS
20.0000 mg | ORAL_TABLET | Freq: Every day | ORAL | Status: DC
Start: 2015-09-22 — End: 2016-11-15

## 2015-09-22 NOTE — [facility] Counselor (Signed)
Pt was instructed to follow up with [facility] for OPT services to assist with feelings of depression and hopelessness. Pt was instructed to attend the walk in clinic @ [facility] on Monday-Friday between the hours of 8a-3p for a triage assessment. Pt was provided with information on the PATH program which will assist him  with transitioning from Homelessness. Pt was also provided with a current list of current homeless shelters in [place] and surrounding areas.  Ardelle Park, MA OBS Counselor

## 2015-09-22 NOTE — Progress Notes (Signed)
D:  Patient pleasant and cooperative; affect anxious; mood depressed; he denies suicidal and homicidal ideation and AVH; no self-injurious behaviors noted or reported. A:  Medications given as ordered; emotional support provided; encouraged him to seek assistance with needs/concerns. R:  Safety maintained on unit.

## 2015-09-22 NOTE — Discharge Instructions (Signed)
Pt was provided with information on the PATH program which will assist him  with transitioning from Homelessness. Pt was also provided with a current list of current homeless shelters in [place] and surrounding areas.
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
## 2015-09-22 NOTE — Progress Notes (Addendum)
Written/verbal discharge instructions and follow-up appointments given to patient with verbalization of understanding;  Patient denies suicidal and homicidal ideation. Discharged home in stable condition.  All patient belongings returned to patient at time of discharge. Patient discharged home with friend.  Bus pass given for transport to f/u appointment.

## 2016-08-03 ENCOUNTER — Encounter (HOSPITAL_COMMUNITY): Payer: Self-pay | Admitting: Emergency Medicine

## 2016-08-03 ENCOUNTER — Emergency Department (HOSPITAL_COMMUNITY): Payer: BLUE CROSS/BLUE SHIELD

## 2016-08-03 ENCOUNTER — Emergency Department (HOSPITAL_COMMUNITY)
Admission: EM | Admit: 2016-08-03 | Discharge: 2016-08-03 | Disposition: A | Discharge status: Home / Self Care | Payer: BLUE CROSS/BLUE SHIELD | Attending: Emergency Medicine | Admitting: Emergency Medicine

## 2016-08-03 DIAGNOSIS — E119 Type 2 diabetes mellitus without complications: Secondary | ICD-10-CM | POA: Diagnosis not present

## 2016-08-03 DIAGNOSIS — R0602 Shortness of breath: Secondary | ICD-10-CM | POA: Diagnosis present

## 2016-08-03 DIAGNOSIS — R062 Wheezing: Secondary | ICD-10-CM | POA: Diagnosis not present

## 2016-08-03 DIAGNOSIS — J9801 Acute bronchospasm: Secondary | ICD-10-CM | POA: Diagnosis not present

## 2016-08-03 MED ORDER — IPRATROPIUM-ALBUTEROL 0.5-2.5 (3) MG/3ML IN SOLN
3.0000 mL | Freq: Once | RESPIRATORY_TRACT | Status: AC
  Administered 2016-08-03: 3 mL via RESPIRATORY_TRACT

## 2016-08-03 MED ORDER — PREDNISONE 20 MG PO TABS
60.0000 mg | ORAL_TABLET | Freq: Once | ORAL | Status: AC
  Administered 2016-08-03: 60 mg via ORAL
  Filled 2016-08-03: qty 3

## 2016-08-03 MED ORDER — ALBUTEROL SULFATE (2.5 MG/3ML) 0.083% IN NEBU
5.0000 mg | INHALATION_SOLUTION | Freq: Once | RESPIRATORY_TRACT | Status: AC
  Administered 2016-08-03: 5 mg via RESPIRATORY_TRACT

## 2016-08-03 MED ORDER — PREDNISONE 10 MG PO TABS: 40.0000 mg | ORAL_TABLET | Freq: Every day | ORAL | 0 refills | Status: AC

## 2016-08-03 MED ORDER — ALBUTEROL SULFATE (2.5 MG/3ML) 0.083% IN NEBU
INHALATION_SOLUTION | RESPIRATORY_TRACT | Status: AC
  Administered 2016-08-03: 5 mg via RESPIRATORY_TRACT
  Filled 2016-08-03: qty 6

## 2016-08-03 NOTE — Discharge Instructions (Signed)
Take the prednisone as prescribed. Continue to use your albuterol inhaler as needed for wheezing or shortness of breath. Return to the emergency department if he started scans fever, chills, chest pain, shortness of breath, or chest tightness. Try to establish care with primary care for further evaluation of lung function and prevention of ER visits.

## 2016-08-03 NOTE — ED Triage Notes (Signed)
Pt presents with worsening sob and wheezing; pt states was dx with bronchitis and wheezing days ago but not getting better

## 2016-08-03 NOTE — ED Provider Notes (Signed)
[facility]-EMER[facility]Y DEPT Provider Note   CSN: [id number] Arrival date & time: 08/03/16  2118   By signing my name below, I, [name], attest that this documentation has been prepared under the direction and in the presence of  Lulla Linville PA-C. Electronically Signed: [name], Scribe. 08/03/2016. 10:26 PM.  History   Chief Complaint Chief Complaint  Patient presents with  . Shortness of Breath  . Wheezing    The history is provided by the patient. No language interpreter was used.     HPI Comments:  Devin [name] is a 46 y.o. male who presents to the Emergency Department complaining of waxing and waning episodes of SOB x 2 weeks. He reports associated wheezing and intermittent episodes of chest tightness. He was evaluated for the same ~ 1 week ago. He was diagnosed with bronchitis and discharged with an inhaler.  He notes episode today feels the same. He used the inhaler without relief. Pt denies h/o similar symptoms outside of the last 2 weeks. No CP, recent cough, palpitations, nausea, fever, chills, or abdominal pain. No smoking history. No illicit drug use. He states he did not get his steroid prescription filled because he has not had time.   Past Medical History:  Diagnosis Date  . Asthma   . Depression   . Diabetes mellitus without complication (HCC)   . Insomnia     Patient Active Problem List   Diagnosis Date Noted  . Major depressive disorder, recurrent episode, moderate (HCC) 09/21/2015    Past Surgical History:  Procedure Laterality Date  . EYE SURGERY     Cataract removed  . TONSILLECTOMY         Home Medications    Prior to Admission medications   Medication Sig Start Date End Date Taking? Authorizing Provider  albuterol (PROVENTIL HFA;VENTOLIN HFA) 108 (90 BASE) MCG/ACT inhaler Inhale 2 puffs into the lungs every 4 (four) hours as needed for wheezing or shortness of breath. 08/03/14   Ward, Layla Maw, DO  escitalopram (LEXAPRO) 20 MG  tablet Take 1 tablet (20 mg total) by mouth daily. 09/22/15   Thermon Leyland, NP  predniSONE (DELTASONE) 10 MG tablet Take 4 tablets (40 mg total) by mouth daily. 08/03/16 08/07/16  Karl Knarr, PA-C  traZODone (DESYREL) 100 MG tablet Take 100 mg by mouth at bedtime as needed for sleep.    [provider]    Family History Family History  Problem Relation Age of Onset  . Cancer Mother   . Cancer Father     Social History Social History  Substance Use Topics  . Smoking status: Never Smoker  . Smokeless tobacco: Never Used  . Alcohol use No     Allergies   Patient has no known allergies.   Review of Systems Review of Systems  Constitutional: Negative for chills and fever.  Respiratory: Positive for shortness of breath and wheezing. Negative for cough.   Cardiovascular: Negative for chest pain and palpitations.  Gastrointestinal: Negative for abdominal pain, nausea and vomiting.     Physical Exam Updated Vital Signs BP 126/86 (BP Location: Right Arm)   Pulse (!) 102   Temp 98.2 F (36.8 C) (Oral)   Resp 18   SpO2 94%   Physical Exam  Constitutional: He is oriented to person, place, and time. He appears well-developed and well-nourished. No distress.  HENT:  Head: Normocephalic and atraumatic.  Right Ear: Tympanic membrane, external ear and ear canal normal.  Left Ear: Tympanic membrane, external ear  and ear canal normal.  Nose: Nose normal.  Mouth/Throat: Uvula is midline, oropharynx is clear and moist and mucous membranes are normal. No oropharyngeal exudate, posterior oropharyngeal edema or posterior oropharyngeal erythema.  Eyes: Conjunctivae and EOM are normal. Pupils are equal, round, and reactive to light.  Cardiovascular: Normal rate, regular rhythm, normal heart sounds and intact distal pulses.   Pulmonary/Chest: Effort normal. No respiratory distress. He has wheezes.  Expiratory wheezing scattered throughout  Abdominal: He exhibits no  distension.  Musculoskeletal: Normal range of motion.  Lymphadenopathy:    He has no cervical adenopathy.  Neurological: He is alert and oriented to person, place, and time.  Skin: Skin is warm and dry.  Psychiatric: He has a normal mood and affect.  Nursing note and vitals reviewed.    ED Treatments / Results  DIAGNOSTIC STUDIES:  Oxygen Saturation is 94% on RA, adequate by my interpretation.    COORDINATION OF CARE:  10:26 PM Discussed treatment plan with pt at bedside and pt agreed to plan.  Labs (all labs ordered are listed, but only abnormal results are displayed) Labs Reviewed - No data to display  EKG  EKG Interpretation None       Radiology Dg Chest 2 View  Result Date: 08/03/2016 CLINICAL DATA:  Acute onset of shortness of breath and wheezing. Initial encounter. EXAM: CHEST  2 VIEW COMPARISON:  Chest radiograph performed 08/03/2014 FINDINGS: The lungs are well-aerated. Peribronchial thickening is noted. There is no evidence of focal opacification, pleural effusion or pneumothorax. The heart is normal in size; the mediastinal contour is within normal limits. No acute osseous abnormalities are seen. IMPRESSION: Peribronchial thickening noted.  Lungs otherwise grossly clear. Electronically Signed   By: Roanna RaiderJeffery  Chang M.D.   On: 08/03/2016 22:20    Procedures Procedures (including critical care time)  Medications Ordered in ED Medications  albuterol (PROVENTIL) (2.5 MG/3ML) 0.083% nebulizer solution 5 mg (5 mg Nebulization Given 08/03/16 2146)  predniSONE (DELTASONE) tablet 60 mg (60 mg Oral Given 08/03/16 2259)  ipratropium-albuterol (DUONEB) 0.5-2.5 (3) MG/3ML nebulizer solution 3 mL (3 mLs Nebulization Given 08/03/16 2258)     Initial Impression / Assessment and Plan / ED Course  I have reviewed the triage vital signs and the nursing notes.  Pertinent labs & imaging results that were available during my care of the patient were reviewed by me and considered in  my medical decision making (see chart for details).     Initial assessment reassuring as patient states shortness of breath has improved from the albuterol nebulizer given in the waiting room. He denies fever, chills, URI symptoms,or cough. On exam his lungs had scattered wheezes throughout. Will give DuoNeb and prednisone and reassess.  On reassessment, patient's lungs were improved and he reported he feels no wheezing, chest tightness, or shortness breath. Will discharge patient with steroid prescription, as he never picked up his last one. Discussed the importance of filling this prescription and taking it. Discussed importance of establishing care with primary care for further evaluation of lungs and to start preventative treatment if necessary. Return precautions given. Patient agrees to plan.  Final Clinical Impressions(s) / ED Diagnoses   Final diagnoses:  Bronchospasm    New Prescriptions New Prescriptions   PREDNISONE (DELTASONE) 10 MG TABLET    Take 4 tablets (40 mg total) by mouth daily.   I personally performed the services described in this documentation, which was scribed in my presence. The recorded information has been reviewed and is accurate.  Alveria Apley, PA-C 08/03/16 2332    Gerhard Munch, MD 08/04/16 1234

## 2016-08-25 ENCOUNTER — Emergency Department (HOSPITAL_COMMUNITY): Payer: BLUE CROSS/BLUE SHIELD

## 2016-08-25 ENCOUNTER — Encounter (HOSPITAL_COMMUNITY): Payer: Self-pay | Admitting: Emergency Medicine

## 2016-08-25 ENCOUNTER — Emergency Department (HOSPITAL_COMMUNITY)
Admission: EM | Admit: 2016-08-25 | Discharge: 2016-08-26 | Disposition: A | Discharge status: Home / Self Care | Payer: BLUE CROSS/BLUE SHIELD | Attending: Emergency Medicine | Admitting: Emergency Medicine

## 2016-08-25 DIAGNOSIS — R0602 Shortness of breath: Secondary | ICD-10-CM | POA: Diagnosis present

## 2016-08-25 DIAGNOSIS — R062 Wheezing: Secondary | ICD-10-CM | POA: Insufficient documentation

## 2016-08-25 DIAGNOSIS — Z79899 Other long term (current) drug therapy: Secondary | ICD-10-CM | POA: Insufficient documentation

## 2016-08-25 DIAGNOSIS — E119 Type 2 diabetes mellitus without complications: Secondary | ICD-10-CM | POA: Diagnosis not present

## 2016-08-25 DIAGNOSIS — J4521 Mild intermittent asthma with (acute) exacerbation: Secondary | ICD-10-CM | POA: Insufficient documentation

## 2016-08-25 MED ORDER — PREDNISONE 20 MG PO TABS
60.0000 mg | ORAL_TABLET | Freq: Once | ORAL | Status: AC
  Administered 2016-08-25: 60 mg via ORAL
  Filled 2016-08-25: qty 3

## 2016-08-25 MED ORDER — PREDNISONE 10 MG PO TABS: 40.0000 mg | ORAL_TABLET | Freq: Every day | ORAL | 0 refills | Status: DC

## 2016-08-25 MED ORDER — ALBUTEROL SULFATE (2.5 MG/3ML) 0.083% IN NEBU
INHALATION_SOLUTION | RESPIRATORY_TRACT | Status: AC
  Filled 2016-08-25: qty 6

## 2016-08-25 MED ORDER — ALBUTEROL SULFATE (2.5 MG/3ML) 0.083% IN NEBU
5.0000 mg | INHALATION_SOLUTION | Freq: Once | RESPIRATORY_TRACT | Status: AC
  Administered 2016-08-25: 5 mg via RESPIRATORY_TRACT

## 2016-08-25 MED ORDER — IPRATROPIUM-ALBUTEROL 0.5-2.5 (3) MG/3ML IN SOLN
3.0000 mL | Freq: Four times a day (QID) | RESPIRATORY_TRACT | Status: DC
  Administered 2016-08-25: 3 mL via RESPIRATORY_TRACT
  Filled 2016-08-25: qty 3

## 2016-08-25 MED ORDER — ALBUTEROL SULFATE HFA 108 (90 BASE) MCG/ACT IN AERS: 4.0000 | INHALATION_SPRAY | RESPIRATORY_TRACT | 1 refills | Status: DC | PRN

## 2016-08-25 NOTE — ED Provider Notes (Signed)
[facility] Provider Note   CSN: [id number] Arrival date & time: 08/25/16  1840     History   Chief Complaint Chief Complaint  Patient presents with  . Shortness of Breath  . Wheezing    HPI Devin [name] is a 46 y.o. male.  The history is provided by the patient.  Shortness of Breath  This is a recurrent problem. The average episode lasts 30 minutes. The problem occurs intermittently.The current episode started 12 to 24 hours ago. The problem has been gradually improving. Associated symptoms include cough and wheezing. Pertinent negatives include no fever, no rhinorrhea, no sore throat, no ear pain, no sputum production, no hemoptysis, no orthopnea, no chest pain, no syncope, no vomiting, no abdominal pain and no rash. He has tried beta-agonist inhalers for the symptoms. The treatment provided mild relief. He has had prior ED visits. Associated medical issues include asthma.  Wheezing   Associated symptoms include cough. Pertinent negatives include no chest pain, no fever, no abdominal pain, no vomiting, no dysuria, no ear pain, no rhinorrhea, no sore throat, no hemoptysis, no sputum production and no rash. His past medical history is significant for asthma.    Past Medical History:  Diagnosis Date  . Asthma   . Depression   . Diabetes mellitus without complication (HCC)   . Insomnia     Patient Active Problem List   Diagnosis Date Noted  . Major depressive disorder, recurrent episode, moderate (HCC) 09/21/2015    Past Surgical History:  Procedure Laterality Date  . EYE SURGERY     Cataract removed  . TONSILLECTOMY         Home Medications    Prior to Admission medications   Medication Sig Start Date End Date Taking? Authorizing Provider  albuterol (PROVENTIL HFA;VENTOLIN HFA) 108 (90 BASE) MCG/ACT inhaler Inhale 2 puffs into the lungs every 4 (four) hours as needed for wheezing or shortness of breath. 08/03/14  Yes Ward, Kristen N, DO  escitalopram  (LEXAPRO) 20 MG tablet Take 1 tablet (20 mg total) by mouth daily. 09/22/15  Yes Thermon Leyland, NP  traZODone (DESYREL) 100 MG tablet Take 100 mg by mouth at bedtime as needed for sleep.   Yes [provider]  albuterol (PROVENTIL HFA;VENTOLIN HFA) 108 (90 Base) MCG/ACT inhaler Inhale 4 puffs into the lungs every 4 (four) hours as needed for wheezing or shortness of breath (every 4 hours). 08/25/16   Orson Slick, MD  predniSONE (DELTASONE) 10 MG tablet Take 4 tablets (40 mg total) by mouth daily. 08/25/16   Orson Slick, MD    Family History Family History  Problem Relation Age of Onset  . Cancer Mother   . Cancer Father     Social History Social History  Substance Use Topics  . Smoking status: Never Smoker  . Smokeless tobacco: Never Used  . Alcohol use No     Allergies   Patient has no known allergies.   Review of Systems Review of Systems  Constitutional: Negative for chills and fever.  HENT: Negative for ear pain, rhinorrhea and sore throat.   Eyes: Negative for pain and visual disturbance.  Respiratory: Positive for cough, shortness of breath and wheezing. Negative for hemoptysis, sputum production, choking, chest tightness and stridor.   Cardiovascular: Negative for chest pain, palpitations, orthopnea and syncope.  Gastrointestinal: Negative for abdominal pain and vomiting.  Endocrine: Negative for polyuria.  Genitourinary: Negative for dysuria and hematuria.  Musculoskeletal: Negative for arthralgias and back pain.  Skin:  Negative for color change and rash.  Allergic/Immunologic: Negative for immunocompromised state.  Neurological: Negative for seizures and syncope.  Psychiatric/Behavioral: Negative for agitation, behavioral problems and confusion.  All other systems reviewed and are negative.    Physical Exam Updated Vital Signs BP 118/83   Pulse 70   Temp 98 F (36.7 C) (Oral)   Resp 20   SpO2 96%   Physical Exam  Constitutional: He is oriented  to person, place, and time. He appears well-developed and well-nourished. No distress.  HENT:  Head: Normocephalic and atraumatic.  Eyes: Conjunctivae and EOM are normal. Pupils are equal, round, and reactive to light.  Neck: Normal range of motion. Neck supple. No tracheal deviation present.  Cardiovascular: Normal rate and regular rhythm.   No murmur heard. Pulmonary/Chest: Effort normal. No respiratory distress. He has wheezes.  Wheezing, predominately on the left lung  Abdominal: Soft. He exhibits no distension. There is no tenderness.  Musculoskeletal: He exhibits no edema or deformity.  Neurological: He is alert and oriented to person, place, and time.  Skin: Skin is warm and dry. He is not diaphoretic.  Psychiatric: He has a normal mood and affect.  Nursing note and vitals reviewed.    ED Treatments / Results  Labs (all labs ordered are listed, but only abnormal results are displayed) Labs Reviewed - No data to display  EKG  EKG Interpretation None       Radiology Dg Chest 2 View  Result Date: 08/25/2016 CLINICAL DATA:  Shortness of breath.  Wheezing.  History of asthma. EXAM: CHEST  2 VIEW COMPARISON:  Radiographs 08/03/2016 FINDINGS: Stable bronchial thickening. The cardiomediastinal contours are normal. The lungs are clear. Pulmonary vasculature is normal. No consolidation, pleural effusion, or pneumothorax. No acute osseous abnormalities are seen. IMPRESSION: Stable bronchial thickening consistent with asthma/reactive airways disease or bronchitis. No localizing or acute abnormality. Electronically Signed   By: Rubye OaksMelanie  Ehinger M.D.   On: 08/25/2016 19:16    Procedures Procedures (including critical care time)  Medications Ordered in ED Medications  ipratropium-albuterol (DUONEB) 0.5-2.5 (3) MG/3ML nebulizer solution 3 mL (3 mLs Nebulization Given 08/25/16 2249)  albuterol (PROVENTIL) (2.5 MG/3ML) 0.083% nebulizer solution 5 mg (5 mg Nebulization Given 08/25/16 1852)    predniSONE (DELTASONE) tablet 60 mg (60 mg Oral Given 08/25/16 2248)     Initial Impression / Assessment and Plan / ED Course  I have reviewed the triage vital signs and the nursing notes.  Pertinent labs & imaging results that were available during my care of the patient were reviewed by me and considered in my medical decision making (see chart for details).     Durward ParcelChristopher Bagnall is a 46 year old male with prior history of asthma coming in today with shortness of breath and wheezing. Patient states for the last week he has had increasing shortness of breath, most recently today at work when he got short of breath while walking to his car. Denies chest pain during this time. Also endorses a dry cough for the last week. Patient states he has albuterol at home however ran out of his prescription earlier today. No fevers, chills, nausea, vomiting, abdominal pain. Denies history of COPD, no smoking history.  On exam patient with wheezing predominantly on the left side. No decreased sounds/focality. Chest x-ray negative for opacifications and is consistent with asthma/reactive airways. Do not believe blood work necessary at this time. Likely mild exacerbation of his asthma. Given albuterol and DuoNeb treatment department as well as steroids. Patient satting  well on room air.  Upon reassessment at 11:30 PM, patient still has very mild wheezing is improved from initial exam after giving duonebs as well as steroids. Patient states he feels much better, is no longer short of breath, and his reassessment exam is reassuring. I do believe he is safe for discharge at this time. Will be given prescription for steroids as outpatient as well as albuterol. Encouraged follow-up with a primary care physician early next week for establishment of care as well as further evaluation and management of his asthma. He was understanding and agreement to the plan and was comfortable with discharge home with outpatient  follow-up.  Patient was seen with my attending, Dr. Eudelia Bunch, who voiced agreement and oversaw the evaluation and treatment of this patient.   Dragon [employer] illustrator was used in the creation of this note. If there are any errors or inconsistencies needing clarification, please contact me directly.   Final Clinical Impressions(s) / ED Diagnoses   Final diagnoses:  Mild intermittent asthma with exacerbation    New Prescriptions New Prescriptions   ALBUTEROL (PROVENTIL HFA;VENTOLIN HFA) 108 (90 BASE) MCG/ACT INHALER    Inhale 4 puffs into the lungs every 4 (four) hours as needed for wheezing or shortness of breath (every 4 hours).   PREDNISONE (DELTASONE) 10 MG TABLET    Take 4 tablets (40 mg total) by mouth daily.     Orson Slick, MD 08/25/16 1610    Nira Conn, MD 08/26/16 [phone]

## 2016-08-25 NOTE — ED Triage Notes (Signed)
Pt states he has history of asthma and works in a [employer] and caused him to have an asthma attack with wheezing and sob. Pt ran out of inhaler today.

## 2016-09-01 ENCOUNTER — Ambulatory Visit (INDEPENDENT_AMBULATORY_CARE_PROVIDER_SITE_OTHER): Payer: BLUE CROSS/BLUE SHIELD | Admitting: Adult Health

## 2016-09-01 ENCOUNTER — Encounter: Payer: Self-pay | Admitting: Adult Health

## 2016-09-01 VITALS — BP 113/79 | HR 59 | Ht 68.0 in | Wt 161.0 lb

## 2016-09-01 DIAGNOSIS — G4700 Insomnia, unspecified: Secondary | ICD-10-CM | POA: Diagnosis not present

## 2016-09-01 DIAGNOSIS — Z0000 Encounter for general adult medical examination without abnormal findings: Secondary | ICD-10-CM | POA: Diagnosis not present

## 2016-09-01 DIAGNOSIS — Z23 Encounter for immunization: Secondary | ICD-10-CM

## 2016-09-01 DIAGNOSIS — F331 Major depressive disorder, recurrent, moderate: Secondary | ICD-10-CM

## 2016-09-01 NOTE — Progress Notes (Signed)
Subjective:    Patient ID: Devin [name], male    DOB: Nov 09, 1970, 46 y.o.   MRN: [id number]  HPI:  Devin [name] is here to establish as a new pt.  He is a very pleasant 46 year old male.  PMH:  Depression, insomnia, and T2D.  He was recently treated for SOB/bronchospasm/dyspnea June/July at local ED.  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  He has completed course of prednisone and only needs to use rescue inhalers 2-3 times/week now.  He reports depression began after mother passed away 2014 from complications of Alzheimers.  [facility] to re-establish with counselor and re-start escitalopram 20mg .  He treats his insomnia with Trazadone 100mg , reports needing medication a few nights of the week.  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  He lost > 40lbs when he started working at [employer] and then came off medication.  He estimates to drink > galloon water/day.  He eats an "ok" diet and denies tobacco/ETOH use.  He was adopted, however reports that his birth parents were both diabetic.   Patient Care Team: Julaine Fusianford, Katy D, NP as PCP - General (Family Medicine)  Patient Active Problem List   Diagnosis Date Noted  . Insomnia 09/01/2016  . Routine health maintenance 09/01/2016  . Major depressive disorder, recurrent episode, moderate (HCC) 09/21/2015    Past Medical History:  Diagnosis Date  . Asthma   . Depression   . Diabetes mellitus without complication (HCC)   . Insomnia     Past Surgical History:  Procedure Laterality Date  . EYE SURGERY     Cataract removed  . TONSILLECTOMY      Family History  Problem Relation Age of Onset  . Cancer Mother   . Alzheimer's disease Mother   . Cancer Father   . Hypertension Father     Social History  Substance Use Topics  . Smoking status: Never Smoker  . Smokeless tobacco:  Never Used  . Alcohol use No   Counseling given: Not Answered   Outpatient Encounter Prescriptions as of 09/01/2016  Medication Sig  . albuterol (PROVENTIL HFA;VENTOLIN HFA) 108 (90 BASE) MCG/ACT inhaler Inhale 2 puffs into the lungs every 4 (four) hours as needed for wheezing or shortness of breath.  Marland Kitchen. albuterol (PROVENTIL HFA;VENTOLIN HFA) 108 (90 Base) MCG/ACT inhaler Inhale 4 puffs into the lungs every 4 (four) hours as needed for wheezing or shortness of breath (every 4 hours).  . escitalopram (LEXAPRO) 20 MG tablet Take 1 tablet (20 mg total) by mouth daily.  . predniSONE (DELTASONE) 10 MG tablet Take 4 tablets (40 mg total) by mouth daily.  . traZODone (DESYREL) 100 MG tablet Take 100 mg by mouth at bedtime as needed for sleep.   No facility-administered encounter medications on file as of 09/01/2016.     No Known Allergies   Review of Systems  Constitutional: Positive for fatigue. Negative for activity change, appetite change, chills, diaphoresis, fever and unexpected weight change.  HENT: Negative for congestion, postnasal drip, sinus pain, sinus pressure, sneezing and sore throat.   Eyes: Negative for visual disturbance.  Respiratory: Negative for cough, chest tightness, shortness of breath, wheezing and stridor.   Cardiovascular: Negative for chest pain, palpitations and leg swelling.  Gastrointestinal: Negative for abdominal distention, abdominal pain, blood in stool, constipation, diarrhea,  nausea and vomiting.  Endocrine: Negative for cold intolerance, heat intolerance, polydipsia, polyphagia and polyuria.  Genitourinary: Negative for difficulty urinating, flank pain and hematuria.  Musculoskeletal: Negative for arthralgias, back pain, gait problem, joint swelling, myalgias, neck pain and neck stiffness.  Skin: Negative for color change, pallor and wound.  Allergic/Immunologic: Negative for immunocompromised state.  Neurological: Negative for tremors, weakness, numbness  and headaches.  Hematological: Does not bruise/bleed easily.  Psychiatric/Behavioral: Positive for dysphoric mood and sleep disturbance. Negative for agitation, behavioral problems, confusion, decreased concentration, hallucinations, self-injury and suicidal ideas. The patient is not nervous/anxious and is not hyperactive.        Objective:   Physical Exam  Constitutional: He is oriented to person, place, and time. He appears well-developed and well-nourished. No distress.  HENT:  Head: Normocephalic and atraumatic.  Right Ear: External ear normal.  Left Ear: External ear normal.  Nose: Nose normal.  Mouth/Throat: Oropharynx is clear and moist. No oropharyngeal exudate.  Eyes: Pupils are equal, round, and reactive to light. Conjunctivae are normal.  Neck: Normal range of motion. Neck supple.  Cardiovascular: Normal rate, regular rhythm, normal heart sounds and intact distal pulses.   No murmur heard. Pulmonary/Chest: Effort normal and breath sounds normal. No respiratory distress. He has no wheezes. He has no rales. He exhibits no tenderness.  Musculoskeletal: Normal range of motion.  Lymphadenopathy:    He has no cervical adenopathy.  Neurological: He is alert and oriented to person, place, and time. Coordination normal.  Skin: Skin is warm and dry. No rash noted. He is not diaphoretic. No erythema. No pallor.  Psychiatric: He has a normal mood and affect. His behavior is normal. Judgment and thought content normal.  Nursing note and vitals reviewed.         Assessment & Plan:   1. Need for Tdap vaccination   2. Routine health maintenance   3. Major depressive disorder, recurrent episode, moderate (HCC)   4. Insomnia, unspecified type     Major depressive disorder, recurrent episode, moderate (HCC) Has appt at [facility] next week to re-establish with therapist and re-start Escitalopram 20mg  Denies thoughts of harming himself/others. He reports strong support system of local  family and friends.  Insomnia Treated with Trazodone 100mg  PRN. Works 2nd shift at [employer], so he goes to bed late and rises late.  Routine health maintenance BP at goal today 113/79 Fasting labs obtained today-will call when results are available. Advised to schedule CPE this year.     Follow up in 3 months for CPE Julaine Fusi, NP

## 2016-09-01 NOTE — Assessment & Plan Note (Addendum)
BP at goal today 113/79 Fasting labs obtained today-will call when results are available. Advised to schedule CPE this year.

## 2016-09-01 NOTE — Assessment & Plan Note (Signed)
Treated with Trazodone 100mg  PRN. Works 2nd shift at [employer], so he goes to bed late and rises late.

## 2016-09-01 NOTE — Patient Instructions (Addendum)
Heart-Healthy Eating Plan Many factors influence your heart health, including eating and exercise habits. Heart (coronary) risk increases with abnormal blood fat (lipid) levels. Heart-healthy meal planning includes limiting unhealthy fats, increasing healthy fats, and making other small dietary changes. This includes maintaining a healthy body weight to help keep lipid levels within a normal range. What is my plan? Your health care provider recommends that you:  Get no more than _________% of the total calories in your daily diet from fat.  Limit your intake of saturated fat to less than _________% of your total calories each day.  Limit the amount of cholesterol in your diet to less than _________ mg per day.  What types of fat should I choose?  Choose healthy fats more often. Choose monounsaturated and polyunsaturated fats, such as olive oil and canola oil, flaxseeds, walnuts, almonds, and seeds.  Eat more omega-3 fats. Good choices include salmon, mackerel, sardines, tuna, flaxseed oil, and ground flaxseeds. Aim to eat fish at least two times each week.  Limit saturated fats. Saturated fats are primarily found in animal products, such as meats, butter, and cream. Plant sources of saturated fats include palm oil, palm kernel oil, and coconut oil.  Avoid foods with partially hydrogenated oils in them. These contain trans fats. Examples of foods that contain trans fats are stick margarine, some tub margarines, cookies, crackers, and other baked goods. What general guidelines do I need to follow?  Check food labels carefully to identify foods with trans fats or high amounts of saturated fat.  Fill one half of your plate with vegetables and green salads. Eat 4-5 servings of vegetables per day. A serving of vegetables equals 1 cup of raw leafy vegetables,  cup of raw or cooked cut-up vegetables, or  cup of vegetable juice.  Fill one fourth of your plate with whole grains. Look for the word  "whole" as the first word in the ingredient list.  Fill one fourth of your plate with lean protein foods.  Eat 4-5 servings of fruit per day. A serving of fruit equals one medium whole fruit,  cup of dried fruit,  cup of fresh, frozen, or canned fruit, or  cup of 100% fruit juice.  Eat more foods that contain soluble fiber. Examples of foods that contain this type of fiber are apples, broccoli, carrots, beans, peas, and barley. Aim to get 20-30 g of fiber per day.  Eat more home-cooked food and less restaurant, buffet, and fast food.  Limit or avoid alcohol.  Limit foods that are high in starch and sugar.  Avoid fried foods.  Cook foods by using methods other than frying. Baking, boiling, grilling, and broiling are all great options. Other fat-reducing suggestions include: ? Removing the skin from poultry. ? Removing all visible fats from meats. ? Skimming the fat off of stews, soups, and gravies before serving them. ? Steaming vegetables in water or broth.  Lose weight if you are overweight. Losing just 5-10% of your initial body weight can help your overall health and prevent diseases such as diabetes and heart disease.  Increase your consumption of nuts, legumes, and seeds to 4-5 servings per week. One serving of dried beans or legumes equals  cup after being cooked, one serving of nuts equals 1 ounces, and one serving of seeds equals  ounce or 1 tablespoon.  You may need to monitor your salt (sodium) intake, especially if you have high blood pressure. Talk with your health care provider or dietitian to get  more information about reducing sodium. What foods can I eat? Grains  Breads, including French, white, pita, wheat, raisin, rye, oatmeal, and Italian. Tortillas that are neither fried nor made with lard or trans fat. Low-fat rolls, including hotdog and hamburger buns and English muffins. Biscuits. Muffins. Waffles. Pancakes. Light popcorn. Whole-grain cereals. Flatbread.  Melba toast. Pretzels. Breadsticks. Rusks. Low-fat snacks and crackers, including oyster, saltine, matzo, graham, animal, and rye. Rice and pasta, including brown rice and those that are made with whole wheat. Vegetables All vegetables. Fruits All fruits, but limit coconut. Meats and Other Protein Sources Lean, well-trimmed beef, veal, pork, and lamb. Chicken and turkey without skin. All fish and shellfish. Wild duck, rabbit, pheasant, and venison. Egg whites or low-cholesterol egg substitutes. Dried beans, peas, lentils, and tofu.Seeds and most nuts. Dairy Low-fat or nonfat cheeses, including ricotta, string, and mozzarella. Skim or 1% milk that is liquid, powdered, or evaporated. Buttermilk that is made with low-fat milk. Nonfat or low-fat yogurt. Beverages Mineral water. Diet carbonated beverages. Sweets and Desserts Sherbets and fruit ices. Honey, jam, marmalade, jelly, and syrups. Meringues and gelatins. Pure sugar candy, such as hard candy, jelly beans, gumdrops, mints, marshmallows, and small amounts of dark chocolate. Angel food cake. Eat all sweets and desserts in moderation. Fats and Oils Nonhydrogenated (trans-free) margarines. Vegetable oils, including soybean, sesame, sunflower, olive, peanut, safflower, corn, canola, and cottonseed. Salad dressings or mayonnaise that are made with a vegetable oil. Limit added fats and oils that you use for cooking, baking, salads, and as spreads. Other Cocoa powder. Coffee and tea. All seasonings and condiments. The items listed above may not be a complete list of recommended foods or beverages. Contact your dietitian for more options. What foods are not recommended? Grains Breads that are made with saturated or trans fats, oils, or whole milk. Croissants. Butter rolls. Cheese breads. Sweet rolls. Donuts. Buttered popcorn. Chow mein noodles. High-fat crackers, such as cheese or butter crackers. Meats and Other Protein Sources Fatty meats, such  as hotdogs, short ribs, sausage, spareribs, bacon, ribeye roast or steak, and mutton. High-fat deli meats, such as salami and bologna. Caviar. Domestic duck and goose. Organ meats, such as kidney, liver, sweetbreads, brains, gizzard, chitterlings, and heart. Dairy Cream, sour cream, cream cheese, and creamed cottage cheese. Whole milk cheeses, including blue (bleu), Monterey Jack, Brie, Colby, American, Havarti, Swiss, cheddar, Camembert, and [place]. Whole or 2% milk that is liquid, evaporated, or condensed. Whole buttermilk. Cream sauce or high-fat cheese sauce. Yogurt that is made from whole milk. Beverages Regular sodas and drinks with added sugar. Sweets and Desserts Frosting. Pudding. Cookies. Cakes other than angel food cake. Candy that has milk chocolate or white chocolate, hydrogenated fat, butter, coconut, or unknown ingredients. Buttered syrups. Full-fat ice cream or ice cream drinks. Fats and Oils Gravy that has suet, meat fat, or shortening. Cocoa butter, hydrogenated oils, palm oil, coconut oil, palm kernel oil. These can often be found in baked products, candy, fried foods, nondairy creamers, and whipped toppings. Solid fats and shortenings, including bacon fat, salt pork, lard, and butter. Nondairy cream substitutes, such as coffee creamers and sour cream substitutes. Salad dressings that are made of unknown oils, cheese, or sour cream. The items listed above may not be a complete list of foods and beverages to avoid. Contact your dietitian for more information. This information is not intended to replace advice given to you by your health care provider. Make sure you discuss any questions you have with your health care   provider. Document Released: 11/17/2007 Document Revised: 08/28/2015 Document Reviewed: 08/01/2013 Elsevier Interactive Patient Education  2017 Elsevier Inc.   Major Depressive Disorder, Adult Major depressive disorder (MDD) is a mental health condition. It may also  be called clinical depression or unipolar depression. MDD usually causes feelings of sadness, hopelessness, or helplessness. MDD can also cause physical symptoms. It can interfere with work, school, relationships, and other everyday activities. MDD may be mild, moderate, or severe. It may occur once (single episode major depressive disorder) or it may occur multiple times (recurrent major depressive disorder). What are the causes? The exact cause of this condition is not known. MDD is most likely caused by a combination of things, which may include:  Genetic factors. These are traits that are passed along from parent to child.  Individual factors. Your personality, your behavior, and the way you handle your thoughts and feelings may contribute to MDD. This includes personality traits and behaviors learned from others.  Physical factors, such as: ? Differences in the part of your brain that controls emotion. This part of your brain may be different than it is in people who do not have MDD. ? Long-term (chronic) medical or psychiatric illnesses.  Social factors. Traumatic experiences or major life changes may play a role in the development of MDD.  What increases the risk? This condition is more likely to develop in women. The following factors may also make you more likely to develop MDD:  A family history of depression.  Troubled family relationships.  Abnormally low levels of certain brain chemicals.  Traumatic events in childhood, especially abuse or the loss of a parent.  Being under a lot of stress, or long-term stress, especially from upsetting life experiences or losses.  A history of: ? Chronic physical illness. ? Other mental health disorders. ? Substance abuse.  Poor living conditions.  Experiencing social exclusion or discrimination on a regular basis.  What are the signs or symptoms? The main symptoms of MDD typically include:  Constant depressed or irritable  mood.  Loss of interest in things and activities.  MDD symptoms may also include:  Sleeping or eating too much or too little.  Unexplained weight change.  Fatigue or low energy.  Feelings of worthlessness or guilt.  Difficulty thinking clearly or making decisions.  Thoughts of suicide or of harming others.  Physical agitation or weakness.  Isolation.  Severe cases of MDD may also occur with other symptoms, such as:  Delusions or hallucinations, in which you imagine things that are not real (psychotic depression).  Low-level depression that lasts at least a year (chronic depression or persistent depressive disorder).  Extreme sadness and hopelessness (melancholic depression).  Trouble speaking and moving (catatonic depression).  How is this diagnosed? This condition may be diagnosed based on:  Your symptoms.  Your medical history, including your mental health history. This may involve tests to evaluate your mental health. You may be asked questions about your lifestyle, including any drug and alcohol use, and how long you have had symptoms of MDD.  A physical exam.  Blood tests to rule out other conditions.  You must have a depressed mood and at least four other MDD symptoms most of the day, nearly every day in the same 2-week timeframe before your health care provider can confirm a diagnosis of MDD. How is this treated? This condition is usually treated by mental health professionals, such as psychologists, psychiatrists, and clinical social workers. You may need more than one type of  treatment. Treatment may include:  Psychotherapy. This is also called talk therapy or counseling. Types of psychotherapy include: ? Cognitive behavioral therapy (CBT). This type of therapy teaches you to recognize unhealthy feelings, thoughts, and behaviors, and replace them with positive thoughts and actions. ? Interpersonal therapy (IPT). This helps you to improve the way you relate to  and communicate with others. ? Family therapy. This treatment includes members of your family.  Medicine to treat anxiety and depression, or to help you control certain emotions and behaviors.  Lifestyle changes, such as: ? Limiting alcohol and drug use. ? Exercising regularly. ? Getting plenty of sleep. ? Making healthy eating choices. ? Spending more time outdoors.  Treatments involving stimulation of the brain can be used in situations with extremely severe symptoms, or when medicine or other therapies do not work over time. These treatments include electroconvulsive therapy, transcranial magnetic stimulation, and vagal nerve stimulation. Follow these instructions at home: Activity  Return to your normal activities as told by your health care provider.  Exercise regularly and spend time outdoors as told by your health care provider. General instructions  Take over-the-counter and prescription medicines only as told by your health care provider.  Do not drink alcohol. If you drink alcohol, limit your alcohol intake to no more than 1 drink a day for nonpregnant women and 2 drinks a day for men. One drink equals 12 oz of beer, 5 oz of wine, or 1 oz of hard liquor. Alcohol can affect any antidepressant medicines you are taking. Talk to your health care provider about your alcohol use.  Eat a healthy diet and get plenty of sleep.  Find activities that you enjoy doing, and make time to do them.  Consider joining a support group. Your health care provider may be able to recommend a support group.  Keep all follow-up visits as told by your health care provider. This is important. Where to find more information: [employer]ness  www.nami.org  [employer]  http://www.maynard.net/www.nimh.nih.gov  National Suicide Prevention Lifeline  1-800-273-TALK 205-262-5380(8255). This is free, 24-hour help.  Contact a health care provider if:  Your symptoms get  worse.  You develop new symptoms. Get help right away if:  You self-harm.  You have serious thoughts about hurting yourself or others.  You see, hear, taste, smell, or feel things that are not present (hallucinate). This information is not intended to replace advice given to you by your health care provider. Make sure you discuss any questions you have with your health care provider. Document Released: 06/04/2012 Document Revised: 10/15/2015 Document Reviewed: 08/19/2015 Elsevier Interactive Patient Education  2017 [employer]  Have a Beaulah DinningGreat Birthday!  Continue healthy eating and regular movement. We will call when lab results are available.   Recommend to schedule a full physical this year. Please call clinic with any questions/concerns.

## 2016-09-01 NOTE — Assessment & Plan Note (Signed)
Has appt at [facility] next week to re-establish with therapist and re-start Escitalopram 20mg  Denies thoughts of harming himself/others. He reports strong support system of local family and friends.

## 2016-09-02 LAB — COMPREHENSIVE METABOLIC PANEL
A/G RATIO: 1.5 (ref 1.2–2.2)
ALBUMIN: 4.1 g/dL (ref 3.5–5.5)
ALT: 10 IU/L (ref 0–44)
AST: 10 IU/L (ref 0–40)
Alkaline Phosphatase: 60 IU/L (ref 39–117)
BILIRUBIN TOTAL: 0.7 mg/dL (ref 0.0–1.2)
BUN / CREAT RATIO: 15 (ref 9–20)
BUN: 18 mg/dL (ref 6–24)
CHLORIDE: 100 mmol/L (ref 96–106)
CO2: 24 mmol/L (ref 20–29)
Calcium: 9.1 mg/dL (ref 8.7–10.2)
Creatinine, Ser: 1.24 mg/dL (ref 0.76–1.27)
GFR calc non Af Amer: 71 mL/min/{1.73_m2} (ref >59)
GFR, EST AFRICAN AMERICAN: 82 mL/min/{1.73_m2} (ref >59)
Globulin, Total: 2.7 g/dL (ref 1.5–4.5)
Glucose: 129 mg/dL — ABNORMAL HIGH (ref 65–99)
POTASSIUM: 4.1 mmol/L (ref 3.5–5.2)
SODIUM: 141 mmol/L (ref 134–144)
TOTAL PROTEIN: 6.8 g/dL (ref 6.0–8.5)

## 2016-09-02 LAB — CBC WITH DIFFERENTIAL/PLATELET
BASOS: 0 %
Basophils Absolute: 0 10*3/uL (ref 0.0–0.2)
EOS (ABSOLUTE): 0.2 10*3/uL (ref 0.0–0.4)
Eos: 3 %
HEMOGLOBIN: 16.2 g/dL (ref 13.0–17.7)
Hematocrit: 47.9 % (ref 37.5–51.0)
IMMATURE GRANS (ABS): 0.1 10*3/uL (ref 0.0–0.1)
Immature Granulocytes: 1 %
LYMPHS: 47 %
Lymphocytes Absolute: 3.9 10*3/uL — ABNORMAL HIGH (ref 0.7–3.1)
MCH: 30.4 pg (ref 26.6–33.0)
MCHC: 33.8 g/dL (ref 31.5–35.7)
MCV: 90 fL (ref 79–97)
Monocytes Absolute: 0.7 10*3/uL (ref 0.1–0.9)
Monocytes: 8 %
NEUTROS ABS: 3.4 10*3/uL (ref 1.4–7.0)
Neutrophils: 41 %
PLATELETS: 257 10*3/uL (ref 150–379)
RBC: 5.33 x10E6/uL (ref 4.14–5.80)
RDW: 14.1 % (ref 12.3–15.4)
WBC: 8.2 10*3/uL (ref 3.4–10.8)

## 2016-09-02 LAB — LIPID PANEL
CHOL/HDL RATIO: 4.7 ratio (ref 0.0–5.0)
Cholesterol, Total: 174 mg/dL (ref 100–199)
HDL: 37 mg/dL — AB (ref >39)
LDL CALC: 97 mg/dL (ref 0–99)
TRIGLYCERIDES: 198 mg/dL — AB (ref 0–149)
VLDL CHOLESTEROL CAL: 40 mg/dL (ref 5–40)

## 2016-09-02 LAB — TSH: TSH: 3.47 u[IU]/mL (ref 0.450–4.500)

## 2016-09-02 LAB — VITAMIN D 25 HYDROXY (VIT D DEFICIENCY, FRACTURES): Vit D, 25-Hydroxy: 15.3 ng/mL — ABNORMAL LOW (ref 30.0–100.0)

## 2016-09-02 LAB — HEMOGLOBIN A1C
Est. average glucose Bld gHb Est-mCnc: 177 mg/dL
HEMOGLOBIN A1C: 7.8 % — AB (ref 4.8–5.6)

## 2016-09-05 ENCOUNTER — Telehealth: Payer: Self-pay | Admitting: Adult Health

## 2016-09-05 ENCOUNTER — Other Ambulatory Visit: Payer: Self-pay | Admitting: Adult Health

## 2016-09-05 DIAGNOSIS — E119 Type 2 diabetes mellitus without complications: Secondary | ICD-10-CM

## 2016-09-05 DIAGNOSIS — E785 Hyperlipidemia, unspecified: Secondary | ICD-10-CM

## 2016-09-05 DIAGNOSIS — E559 Vitamin D deficiency, unspecified: Secondary | ICD-10-CM

## 2016-09-05 DIAGNOSIS — E1169 Type 2 diabetes mellitus with other specified complication: Secondary | ICD-10-CM

## 2016-09-05 MED ORDER — METFORMIN HCL 500 MG PO TABS: 500.0000 mg | ORAL_TABLET | Freq: Two times a day (BID) | ORAL | 3 refills | Status: AC

## 2016-09-05 MED ORDER — ATORVASTATIN CALCIUM 10 MG PO TABS: 10.0000 mg | ORAL_TABLET | Freq: Every day | ORAL | 3 refills | Status: DC

## 2016-09-05 MED ORDER — TRAZODONE HCL 100 MG PO TABS: 100.0000 mg | ORAL_TABLET | Freq: Every evening | ORAL | 2 refills | Status: AC | PRN

## 2016-09-05 MED ORDER — VITAMIN D (ERGOCALCIFEROL) 1.25 MG (50000 UNIT) PO CAPS: 50000.0000 [IU] | ORAL_CAPSULE | ORAL | 0 refills | Status: AC

## 2016-09-05 NOTE — Telephone Encounter (Signed)
Sent in Thanks! Damein Gaunce  

## 2016-09-05 NOTE — Telephone Encounter (Signed)
Patient is requesting a refill of his trazodone to be sent to [facility] [address]

## 2016-09-06 ENCOUNTER — Telehealth: Payer: Self-pay

## 2016-09-06 MED ORDER — BLOOD GLUCOSE MONITOR KIT: PACK | 0 refills | Status: AC

## 2016-09-06 MED ORDER — LANCETS MISC: 12 refills | Status: AC

## 2016-09-06 MED ORDER — BLOOD GLUCOSE TEST VI STRP: ORAL_STRIP | 12 refills | Status: AC

## 2016-09-06 NOTE — Telephone Encounter (Signed)
Left message for patient to call office regarding lab results and recommendations.  Orders for glucometer and tools placed.

## 2016-09-06 NOTE — Telephone Encounter (Signed)
-----  Message from Esaw Grandchild, NP sent at 09/05/2016  5:26 PM EDT ----- Good Evening, Please call Devin [name] and share that his labs WNL, exception: BS 129 (normal <99), A1c 7.8 (normal <6.4)- he needs to re-start Metformin '500mg'$  1 tab nightly for the first two weeks, then increase to one tab in am and pm. Please stress to start with just one tab at night for week one, then increase-sig would not allow for me to type out instructions. Please check BS each am and prior to meals and bring BS log to follow-up in 3 months. Also lipid panel:  TGs 198 (normal <149), and HDL 37 (normal >39)-these numbers increase his ASCVD risk to 3.2%, optimal for his age 44.8%-please start Atorvastatin '10mg'$  daily in the evening.   He needs a nurse visit in 6 weeks to check ALT (due to statin therapy) and provider OV in 3 months for A1c/T2D f/u. I put in meds and lab orders, can you please put in order for glucose monitoring kit, strip,lancets?  Thanks! Valetta Fuller

## 2016-10-03 ENCOUNTER — Other Ambulatory Visit: Payer: Self-pay

## 2016-10-06 ENCOUNTER — Encounter: Payer: Self-pay | Admitting: Adult Health

## 2016-10-06 ENCOUNTER — Ambulatory Visit (INDEPENDENT_AMBULATORY_CARE_PROVIDER_SITE_OTHER): Payer: BLUE CROSS/BLUE SHIELD | Admitting: Adult Health

## 2016-10-06 VITALS — BP 107/66 | HR 57 | Ht 68.0 in | Wt 161.0 lb

## 2016-10-06 DIAGNOSIS — W57XXXA Bitten or stung by nonvenomous insect and other nonvenomous arthropods, initial encounter: Secondary | ICD-10-CM | POA: Diagnosis not present

## 2016-10-06 DIAGNOSIS — E119 Type 2 diabetes mellitus without complications: Secondary | ICD-10-CM

## 2016-10-06 DIAGNOSIS — Z23 Encounter for immunization: Secondary | ICD-10-CM

## 2016-10-06 DIAGNOSIS — F339 Major depressive disorder, recurrent, unspecified: Secondary | ICD-10-CM

## 2016-10-06 MED ORDER — TRIAMCINOLONE ACETONIDE 0.1 % EX CREA: 1.0000 "application " | TOPICAL_CREAM | Freq: Two times a day (BID) | CUTANEOUS | 1 refills | Status: DC

## 2016-10-06 NOTE — Patient Instructions (Signed)
Bedbugs Bedbugs are tiny bugs that live in and around beds. During the day, they stay hidden. At night, they come out and bite. Where are bedbugs found? Bedbugs can be found anywhere. It does not matter if a place is clean or dirty. They are often found in:  Hotels.  Shelters.  Dorms.  Hospitals.  Nursing homes.  Places where there are many birds or bats.  What are bedbug bites like? A bedbug bite leaves a small red bump with a darker red dot in the middle. The bump may show up soon after a person is bitten or a day or more later. Bedbug bites usually do not hurt, but they may itch. Most people do not need treatment for bedbug bites. The bumps usually go away on their own in a few days. How do I check for bedbugs? Bedbugs are reddish-brown, oval, and flat. They are very small and they cannot fly. Look for bedbugs in these places:  On mattresses, bed frames, headboards, and box springs.  On drapes and curtains in bedrooms.  Under the carpet in bedrooms.  Behind electrical outlets.  Behind any wallpaper that is peeling.  Inside luggage.  Also look for black or red spots or stains on or near the bed. What should I do if I find bedbugs? When Traveling Check your clothes, suitcase, and belongings for bedbugs before you go back home. You may want to throw away anything that has bedbugs on it. At Home Your bedroom may need to be treated by a pest control expert. You may also need to throw away mattresses or luggage. To help keep bedbugs from coming back, you may want to:  Put a plastic cover over your mattress.  Wash your clothes and bedding in water that is hotter than 120F (48.9C). Dry them on a hot setting.  Vacuum often around the bed and in all of the cracks where the bugs might hide.  Check all used furniture, bedding, or clothes that you bring into your home.  Get rid of bird nests and bat roosts that are near your home.  In Your Bed Try wearing pajamas that  have long sleeves and pant legs. Bedbugs usually bite areas of the skin that are not covered. This information is not intended to replace advice given to you by your health care provider. Make sure you discuss any questions you have with your health care provider. Document Released: 05/25/2010 Document Revised: 07/16/2015 Document Reviewed: 02/03/2014 Elsevier Interactive Patient Education  2018 [employer]  Please use cream as directed. To help ease itching, especially at night please use OTC Benadryl per manufacturer's instructions. Referral for mental health care provider placed, please establish with therapist ASAP. Please keep follow-up in Oct for chronic conditions. Please call clinic with any questions/concerns. NICE TO SEE YOU!

## 2016-10-06 NOTE — Assessment & Plan Note (Signed)
He denies suicidal plans, however continues to experience depression. CBT referral placed. Continue on Escitalopram 20mg  daily Continue to avoid EOTH

## 2016-10-06 NOTE — Assessment & Plan Note (Signed)
Triamcinolone Cream 1% BID Keep sites dry/clean, do not scratch OTC Diphenhydramine QHS PRN If sx's persist >2weeks, please call clinic

## 2016-10-06 NOTE — Progress Notes (Signed)
Subjective:    Patient ID: Devin [name], male    DOB: 1970-10-09, 46 y.o.   MRN: [id number]  HPI:  Mr. Bultman presents with rash on bil thighs.  He spent weekend at a home with known "bedbugs" and then  Monday he developed circular red whelps that itch and ache.  He denies fever/night sweats/malaise/N/V/D. He has been using OTC hydrocortisone cream and OTC antibacterial cream with minimal sx relief.   His PHQ score was 17, we discussed this at length and he denies suicidal plans but continues to experience depression.  His last CBT was 2017 and he is interested in re-establishing with new mental health care provider.  Patient Care Team    Relationship Specialty Notifications Start End  Esaw Grandchild, NP PCP - General Family Medicine  09/01/16     Patient Active Problem List   Diagnosis Date Noted  . Bedbug bite 10/06/2016  . Type 2 diabetes mellitus without complication, without long-term current use of insulin ([place]) 09/05/2016  . Hyperlipidemia associated with type 2 diabetes mellitus ([place]) 09/05/2016  . Insomnia 09/01/2016  . Routine health maintenance 09/01/2016  . Depression, recurrent ([place]) 09/21/2015  . Major depressive disorder, recurrent episode, moderate ([place]) 09/21/2015     Past Medical History:  Diagnosis Date  . Asthma   . Depression   . Diabetes mellitus without complication ([place])   . Insomnia      Past Surgical History:  Procedure Laterality Date  . EYE SURGERY     Cataract removed  . TONSILLECTOMY       Family History  Problem Relation Age of Onset  . Cancer Mother   . Alzheimer's disease Mother   . Cancer Father   . Hypertension Father      History  Drug Use No     History  Alcohol Use No     History  Smoking Status  . Never Smoker  Smokeless Tobacco  . Never Used     Outpatient Encounter Prescriptions as of 10/06/2016  Medication Sig  . albuterol (PROVENTIL HFA;VENTOLIN HFA) 108 (90 BASE) MCG/ACT inhaler Inhale 2  puffs into the lungs every 4 (four) hours as needed for wheezing or shortness of breath.  Marland Kitchen albuterol (PROVENTIL HFA;VENTOLIN HFA) 108 (90 Base) MCG/ACT inhaler Inhale 4 puffs into the lungs every 4 (four) hours as needed for wheezing or shortness of breath (every 4 hours).  Marland Kitchen atorvastatin (LIPITOR) 10 MG tablet Take 1 tablet (10 mg total) by mouth daily.  . blood glucose meter kit and supplies KIT Dispense based on patient and insurance preference. Use up to four times daily as directed. DX E11.9  . escitalopram (LEXAPRO) 20 MG tablet Take 1 tablet (20 mg total) by mouth daily.  . Glucose Blood (BLOOD GLUCOSE TEST STRIPS) STRP Test four times a day as needed.  DX E11.9  . Lancets MISC Test four times daily as needed.  DX E11.9  . metFORMIN (GLUCOPHAGE) 500 MG tablet Take 1 tablet (500 mg total) by mouth 2 (two) times daily with a meal.  . traZODone (DESYREL) 100 MG tablet Take 1 tablet (100 mg total) by mouth at bedtime as needed for sleep.  . Vitamin D, Ergocalciferol, (DRISDOL) 50000 units CAPS capsule Take 1 capsule (50,000 Units total) by mouth every 7 (seven) days.  . [DISCONTINUED] predniSONE (DELTASONE) 10 MG tablet Take 4 tablets (40 mg total) by mouth daily.  Marland Kitchen triamcinolone cream (KENALOG) 0.1 % Apply 1 application topically 2 (two) times daily.  No facility-administered encounter medications on file as of 10/06/2016.     Allergies: Patient has no known allergies.  Body mass index is 24.48 kg/m.  Blood pressure 107/66, pulse (!) 57, height '5\' 8"'  (1.727 m), weight 161 lb (73 kg).    Review of Systems  Constitutional: Negative for activity change, appetite change, chills, diaphoresis, fever and unexpected weight change.  Eyes: Negative for visual disturbance.  Respiratory: Negative for cough, chest tightness, shortness of breath, wheezing and stridor.   Cardiovascular: Negative for chest pain, palpitations and leg swelling.  Gastrointestinal: Negative for abdominal distention,  abdominal pain, blood in stool, constipation, diarrhea, nausea and vomiting.  Endocrine: Negative for cold intolerance, heat intolerance, polydipsia, polyphagia and polyuria.  Genitourinary: Negative for difficulty urinating, discharge, genital sores, hematuria, scrotal swelling and testicular pain.  Skin: Positive for color change and rash. Negative for pallor and wound.  Neurological: Negative for dizziness, tremors and weakness.  Hematological: Does not bruise/bleed easily.  Psychiatric/Behavioral: Positive for dysphoric mood and sleep disturbance. Negative for agitation, self-injury and suicidal ideas. The patient is not nervous/anxious and is not hyperactive.        Objective:   Physical Exam  Constitutional: He is oriented to person, place, and time. He appears well-developed and well-nourished. No distress.  Cardiovascular: Normal rate, regular rhythm, normal heart sounds and intact distal pulses.   No murmur heard. Pulmonary/Chest: Effort normal and breath sounds normal. No respiratory distress. He has no wheezes. He has no rales. He exhibits no tenderness.  Neurological: He is alert and oriented to person, place, and time.  Skin: Skin is warm and dry. Rash noted. Rash is maculopapular. He is not diaphoretic. No pallor.     Maculopapular rash on bil thighs.  Areas slightly warm to the touch and reddened.  No open tissue/drainage/streaking noted.  Areas not TTP  Psychiatric: He has a normal mood and affect. His behavior is normal. Judgment and thought content normal.  Nursing note and vitals reviewed.         Assessment & Plan:   1. Type 2 diabetes mellitus without complication, without long-term current use of insulin ([place])   2. Need for pneumococcal vaccination   3. Depression, recurrent ([place])   4. Bedbug bite, initial encounter     Depression, recurrent ([place]) He denies suicidal plans, however continues to experience depression. CBT referral placed. Continue on  Escitalopram 11m daily Continue to avoid EOTH  Bedbug bite Triamcinolone Cream 1% BID Keep sites dry/clean, do not scratch OTC Diphenhydramine QHS PRN If sx's persist >2weeks, please call clinic    FOLLOW-UP:  Return if symptoms worsen or fail to improve.

## 2016-10-25 ENCOUNTER — Emergency Department (HOSPITAL_COMMUNITY)
Admission: EM | Admit: 2016-10-25 | Discharge: 2016-10-25 | Disposition: A | Discharge status: Home / Self Care | Payer: BLUE CROSS/BLUE SHIELD | Attending: Emergency Medicine | Admitting: Emergency Medicine

## 2016-10-25 ENCOUNTER — Encounter (HOSPITAL_COMMUNITY): Payer: Self-pay

## 2016-10-25 ENCOUNTER — Emergency Department (HOSPITAL_COMMUNITY): Payer: BLUE CROSS/BLUE SHIELD

## 2016-10-25 DIAGNOSIS — E119 Type 2 diabetes mellitus without complications: Secondary | ICD-10-CM | POA: Insufficient documentation

## 2016-10-25 DIAGNOSIS — J4521 Mild intermittent asthma with (acute) exacerbation: Secondary | ICD-10-CM | POA: Diagnosis not present

## 2016-10-25 DIAGNOSIS — Z7984 Long term (current) use of oral hypoglycemic drugs: Secondary | ICD-10-CM | POA: Insufficient documentation

## 2016-10-25 DIAGNOSIS — R0602 Shortness of breath: Secondary | ICD-10-CM | POA: Diagnosis present

## 2016-10-25 LAB — CBC
HEMATOCRIT: 46.7 % (ref 39.0–52.0)
Hemoglobin: 15.4 g/dL (ref 13.0–17.0)
MCH: 30.1 pg (ref 26.0–34.0)
MCHC: 33 g/dL (ref 30.0–36.0)
MCV: 91.2 fL (ref 78.0–100.0)
Platelets: 223 10*3/uL (ref 150–400)
RBC: 5.12 MIL/uL (ref 4.22–5.81)
RDW: 13.7 % (ref 11.5–15.5)
WBC: 8.8 10*3/uL (ref 4.0–10.5)

## 2016-10-25 LAB — BASIC METABOLIC PANEL
Anion gap: 10 (ref 5–15)
BUN: 11 mg/dL (ref 6–20)
CHLORIDE: 105 mmol/L (ref 101–111)
CO2: 23 mmol/L (ref 22–32)
Calcium: 9.2 mg/dL (ref 8.9–10.3)
Creatinine, Ser: 1.29 mg/dL — ABNORMAL HIGH (ref 0.61–1.24)
GFR calc non Af Amer: >60 mL/min (ref >60)
Glucose, Bld: 130 mg/dL — ABNORMAL HIGH (ref 65–99)
POTASSIUM: 4.3 mmol/L (ref 3.5–5.1)
SODIUM: 138 mmol/L (ref 135–145)

## 2016-10-25 MED ORDER — ALBUTEROL SULFATE (2.5 MG/3ML) 0.083% IN NEBU
INHALATION_SOLUTION | RESPIRATORY_TRACT | Status: AC
  Filled 2016-10-25: qty 6

## 2016-10-25 MED ORDER — DEXAMETHASONE SODIUM PHOSPHATE 10 MG/ML IJ SOLN
10.0000 mg | Freq: Once | INTRAMUSCULAR | Status: AC
  Administered 2016-10-25: 10 mg via INTRAMUSCULAR
  Filled 2016-10-25: qty 1

## 2016-10-25 MED ORDER — ALBUTEROL SULFATE (2.5 MG/3ML) 0.083% IN NEBU
5.0000 mg | INHALATION_SOLUTION | Freq: Once | RESPIRATORY_TRACT | Status: AC
  Administered 2016-10-25: 5 mg via RESPIRATORY_TRACT

## 2016-10-25 MED ORDER — PREDNISONE 10 MG PO TABS: 40.0000 mg | ORAL_TABLET | Freq: Every day | ORAL | 0 refills | Status: AC

## 2016-10-25 NOTE — Discharge Instructions (Signed)
It is very important that you keep your inhaler with you. Use your inhaler when you have have shortness of breath or chest tightness. Take prednisone as prescribed. It is important that you stay well-hydrated over the next several days. Follow-up with your primary care doctor as needed for further evaluation of your asthma or refill of your inhaler. Return to the emergency room if you develop fever, chills, persistent chest tightness, worsening shortness of breath, or any new or worsening symptoms.

## 2016-10-25 NOTE — ED Provider Notes (Signed)
[facility] DEPT Provider Note   CSN: [id number] Arrival date & time: 10/25/16  1708     History   Chief Complaint Chief Complaint  Patient presents with  . Shortness of Breath    HPI Devin [name] is a 46 y.o. male presenting with shortness of breath.  Patient states that he was having some shortness of breath and chest tightness just prior to going to work. While he was at work, he had worsening symptoms. He had left his inhaler at home, so he was not able to use his inhaler. Instead, he came to the emergency room for treatment. He was given a treatment in triage, and had resolution of symptoms. He currently denies chest pain, shortness of breath, chest tightness, or wheezing. He denies recent fever, chills, congestion, sore throat, cough, nausea, vomiting, abdominal pain, urinary symptoms, or abnormal bowel movements. He denies sick contacts. He states he has followed up with his primary care doctor, who has not put him on any preventative medicine for his asthma attacks. Patient denies any symptoms currently.  HPI  Past Medical History:  Diagnosis Date  . Asthma   . Depression   . Diabetes mellitus without complication ([place])   . Insomnia     Patient Active Problem List   Diagnosis Date Noted  . Bedbug bite 10/06/2016  . Type 2 diabetes mellitus without complication, without long-term current use of insulin ([place]) 09/05/2016  . Hyperlipidemia associated with type 2 diabetes mellitus ([place]) 09/05/2016  . Insomnia 09/01/2016  . Routine health maintenance 09/01/2016  . Depression, recurrent ([place]) 09/21/2015  . Major depressive disorder, recurrent episode, moderate ([place]) 09/21/2015    Past Surgical History:  Procedure Laterality Date  . EYE SURGERY     Cataract removed  . TONSILLECTOMY         Home Medications    Prior to Admission medications   Medication Sig Start Date End Date Taking? Authorizing Provider  albuterol (PROVENTIL HFA;VENTOLIN HFA) 108 (90  BASE) MCG/ACT inhaler Inhale 2 puffs into the lungs every 4 (four) hours as needed for wheezing or shortness of breath. 08/03/14   Ward, Delice Bison, DO  albuterol (PROVENTIL HFA;VENTOLIN HFA) 108 (90 Base) MCG/ACT inhaler Inhale 4 puffs into the lungs every 4 (four) hours as needed for wheezing or shortness of breath (every 4 hours). 08/25/16   Valda Lamb, MD  atorvastatin (LIPITOR) 10 MG tablet Take 1 tablet (10 mg total) by mouth daily. 09/05/16   [place], Valetta Fuller D, NP  blood glucose meter kit and supplies KIT Dispense based on patient and insurance preference. Use up to four times daily as directed. DX E11.9 09/06/16   [place], Valetta Fuller D, NP  escitalopram (LEXAPRO) 20 MG tablet Take 1 tablet (20 mg total) by mouth daily. 09/22/15   Niel Hummer, NP  Glucose Blood (BLOOD GLUCOSE TEST STRIPS) STRP Test four times a day as needed.  DX E11.9 09/06/16   Mina Marble D, NP  Lancets MISC Test four times daily as needed.  DX E11.9 09/06/16   [place], Valetta Fuller D, NP  metFORMIN (GLUCOPHAGE) 500 MG tablet Take 1 tablet (500 mg total) by mouth 2 (two) times daily with a meal. 09/05/16   [place], Valetta Fuller D, NP  predniSONE (DELTASONE) 10 MG tablet Take 4 tablets (40 mg total) by mouth daily. 10/25/16 10/30/16  Catrina Fellenz, PA-C  traZODone (DESYREL) 100 MG tablet Take 1 tablet (100 mg total) by mouth at bedtime as needed for sleep. 09/05/16   Esaw Grandchild, NP  triamcinolone cream (KENALOG) 0.1 % Apply 1 application topically 2 (two) times daily. 10/06/16   [place], Valetta Fuller D, NP  Vitamin D, Ergocalciferol, (DRISDOL) 50000 units CAPS capsule Take 1 capsule (50,000 Units total) by mouth every 7 (seven) days. 09/05/16   Esaw Grandchild, NP    Family History Family History  Problem Relation Age of Onset  . Cancer Mother   . Alzheimer's disease Mother   . Cancer Father   . Hypertension Father     Social History Social History  Substance Use Topics  . Smoking status: Never Smoker  . Smokeless tobacco: Never Used  . Alcohol  use No     Allergies   Patient has no known allergies.   Review of Systems Review of Systems  Constitutional: Negative for chills and fever.  HENT: Negative for congestion and sore throat.   Respiratory: Positive for chest tightness (Resolved), shortness of breath (Resolved) and wheezing (Resolved). Negative for cough.   Cardiovascular: Negative for chest pain, palpitations and leg swelling.  Gastrointestinal: Negative for abdominal pain, constipation, diarrhea, nausea and vomiting.  Genitourinary: Negative for dysuria, frequency and hematuria.     Physical Exam Updated Vital Signs BP 119/86 (BP Location: Right Arm)   Pulse 84   Temp 98 F (36.7 C)   Resp 18   Ht '5\' 8"'  (1.727 m)   Wt 73 kg (161 lb)   SpO2 98%   BMI 24.48 kg/m   Physical Exam  Constitutional: He is oriented to person, place, and time. He appears well-developed and well-nourished. No distress.  HENT:  Head: Normocephalic and atraumatic.  Right Ear: Tympanic membrane, external ear and ear canal normal.  Left Ear: Tympanic membrane, external ear and ear canal normal.  Nose: Nose normal.  Mouth/Throat: Uvula is midline, oropharynx is clear and moist and mucous membranes are normal.  Eyes: Pupils are equal, round, and reactive to light. Conjunctivae and EOM are normal.  Neck: Normal range of motion.  Cardiovascular: Normal rate, regular rhythm and intact distal pulses.   Pulmonary/Chest: Effort normal and breath sounds normal. No respiratory distress. He has no decreased breath sounds. He has no wheezes. He has no rhonchi. He has no rales. He exhibits no tenderness.  Good air movement in all lung fields. No wheezing or other adventitious sounds.  Abdominal: Soft. Bowel sounds are normal. He exhibits no distension. There is no tenderness. There is no guarding.  Musculoskeletal: Normal range of motion.  Lymphadenopathy:    He has no cervical adenopathy.  Neurological: He is alert and oriented to person,  place, and time.  Skin: Skin is warm and dry. He is not diaphoretic.  Psychiatric: He has a normal mood and affect.  Nursing note and vitals reviewed.    ED Treatments / Results  Labs (all labs ordered are listed, but only abnormal results are displayed) Labs Reviewed  BASIC METABOLIC PANEL - Abnormal; Notable for the following:       Result Value   Glucose, Bld 130 (*)    Creatinine, Ser 1.29 (*)    All other components within normal limits  CBC    EKG  EKG Interpretation None       Radiology Dg Chest 2 View  Result Date: 10/25/2016 CLINICAL DATA:  Shortness of breath and wheezing. History of asthma. EXAM: CHEST  2 VIEW COMPARISON:  Chest radiograph Jun 25, 2016 and chest radiograph August 03, 2014 FINDINGS: Similar bronchitic changes without pleural effusion or focal consolidation. No pneumothorax. Normal lung volumes.  Soft tissue planes and included osseous structures are nonsuspicious. IMPRESSION: Similar bronchitic changes without focal consolidation. Electronically Signed   By: Elon Alas M.D.   On: 10/25/2016 17:54    Procedures Procedures (including critical care time)  Medications Ordered in ED Medications  albuterol (PROVENTIL) (2.5 MG/3ML) 0.083% nebulizer solution 5 mg (5 mg Nebulization Given 10/25/16 1727)  dexamethasone (DECADRON) injection 10 mg (10 mg Intramuscular Given 10/25/16 2052)     Initial Impression / Assessment and Plan / ED Course  I have reviewed the triage vital signs and the nursing notes.  Pertinent labs & imaging results that were available during my care of the patient were reviewed by me and considered in my medical decision making (see chart for details).     Patient presenting with asthma exacerbation. Symptoms resolved with treatment in triage. Physical exam reassuring, as patient has good air movement in all lung fields without wheezing. Gave shot of Decadron and prescription for steroids to prevent upcoming exacerbations.  Discussed follow-up with primary care for discussion about preventative medicine. No recent illness, cough, congestion, or fever, doubt pneumonia. I do not believe x-rays are indicated at this time. At this time, patient appears safe for discharge. Return precautions given. Patient states he understands and agrees to plan.  Final Clinical Impressions(s) / ED Diagnoses   Final diagnoses:  Mild intermittent asthma with exacerbation    New Prescriptions Discharge Medication List as of 10/25/2016  8:45 PM    START taking these medications   Details  predniSONE (DELTASONE) 10 MG tablet Take 4 tablets (40 mg total) by mouth daily., Starting Tue 10/25/2016, Until Sun 10/30/2016, Print         Jenel Gierke, PA-C 10/26/16 2694    Noemi Chapel, MD 10/26/16 1537

## 2016-10-25 NOTE — ED Triage Notes (Signed)
Pt reports increased shortness of breath while outside today. Hx of asthma. Reports recent asthma attacks. Wheezing noted on auscultation. Skin warm and dry. No distress noted.

## 2016-11-15 ENCOUNTER — Encounter: Payer: Self-pay | Admitting: Adult Health

## 2016-11-15 ENCOUNTER — Ambulatory Visit (INDEPENDENT_AMBULATORY_CARE_PROVIDER_SITE_OTHER): Payer: BLUE CROSS/BLUE SHIELD | Admitting: Adult Health

## 2016-11-15 VITALS — BP 109/74 | HR 86 | Temp 97.7°F | Ht 68.0 in | Wt 159.3 lb

## 2016-11-15 DIAGNOSIS — L0291 Cutaneous abscess, unspecified: Secondary | ICD-10-CM

## 2016-11-15 DIAGNOSIS — E119 Type 2 diabetes mellitus without complications: Secondary | ICD-10-CM

## 2016-11-15 DIAGNOSIS — E1169 Type 2 diabetes mellitus with other specified complication: Secondary | ICD-10-CM | POA: Diagnosis not present

## 2016-11-15 DIAGNOSIS — F339 Major depressive disorder, recurrent, unspecified: Secondary | ICD-10-CM

## 2016-11-15 DIAGNOSIS — Z0000 Encounter for general adult medical examination without abnormal findings: Secondary | ICD-10-CM

## 2016-11-15 DIAGNOSIS — L02416 Cutaneous abscess of left lower limb: Secondary | ICD-10-CM | POA: Diagnosis not present

## 2016-11-15 DIAGNOSIS — E785 Hyperlipidemia, unspecified: Secondary | ICD-10-CM | POA: Diagnosis not present

## 2016-11-15 LAB — POCT UA - MICROALBUMIN
Albumin/Creatinine Ratio, Urine, POC: <30
CREATININE, POC: 200 mg/dL
Microalbumin Ur, POC: 30 mg/L

## 2016-11-15 LAB — POCT GLYCOSYLATED HEMOGLOBIN (HGB A1C): Hemoglobin A1C: 6.3

## 2016-11-15 MED ORDER — ESCITALOPRAM OXALATE 20 MG PO TABS: 20.0000 mg | ORAL_TABLET | Freq: Every day | ORAL | 0 refills | Status: DC

## 2016-11-15 MED ORDER — ESCITALOPRAM OXALATE 20 MG PO TABS: 20.0000 mg | ORAL_TABLET | Freq: Every day | ORAL | 0 refills | Status: AC

## 2016-11-15 MED ORDER — DOXYCYCLINE HYCLATE 100 MG PO TABS: 100.0000 mg | ORAL_TABLET | Freq: Two times a day (BID) | ORAL | 0 refills | Status: AC

## 2016-11-15 MED ORDER — ALBUTEROL SULFATE HFA 108 (90 BASE) MCG/ACT IN AERS: 2.0000 | INHALATION_SPRAY | RESPIRATORY_TRACT | 3 refills | Status: AC | PRN

## 2016-11-15 MED ORDER — MONTELUKAST SODIUM 10 MG PO TABS: 10.0000 mg | ORAL_TABLET | Freq: Every day | ORAL | 2 refills | Status: AC

## 2016-11-15 NOTE — Assessment & Plan Note (Signed)
Moderate-intensity statin started 09/05/2016 due to T2D. He continues to abstain from ETOH and denies myalgia's. Lipids re-checked today, will run ASCVD Risk Estimator once results available.

## 2016-11-15 NOTE — Assessment & Plan Note (Signed)
Re-started on Escitalopram  daily. Please call clinic in 3 weeks and let us know how you are feeling on the Escitalopram-if no side effects we will refill medication. CBT referral placed 10/06/16- strongly encouraged to establish with mental health care provider.

## 2016-11-15 NOTE — Patient Instructions (Addendum)
Heart-Healthy Eating Plan Many factors influence your heart health, including eating and exercise habits. Heart (coronary) risk increases with abnormal blood fat (lipid) levels. Heart-healthy meal planning includes limiting unhealthy fats, increasing healthy fats, and making other small dietary changes. This includes maintaining a healthy body weight to help keep lipid levels within a normal range. What is my plan? Your health care provider recommends that you:  Get no more than ___25__% of the total calories in your daily diet from fat.  Limit your intake of saturated fat to less than ___5___% of your total calories each day.  Limit the amount of cholesterol in your diet to less than __300__ mg per day.  What types of fat should I choose?  Choose healthy fats more often. Choose monounsaturated and polyunsaturated fats, such as olive oil and canola oil, flaxseeds, walnuts, almonds, and seeds.  Eat more omega-3 fats. Good choices include salmon, mackerel, sardines, tuna, flaxseed oil, and ground flaxseeds. Aim to eat fish at least two times each week.  Limit saturated fats. Saturated fats are primarily found in animal products, such as meats, butter, and cream. Plant sources of saturated fats include palm oil, palm kernel oil, and coconut oil.  Avoid foods with partially hydrogenated oils in them. These contain trans fats. Examples of foods that contain trans fats are stick margarine, some tub margarines, cookies, crackers, and other baked goods. What general guidelines do I need to follow?  Check food labels carefully to identify foods with trans fats or high amounts of saturated fat.  Fill one half of your plate with vegetables and green salads. Eat 4-5 servings of vegetables per day. A serving of vegetables equals 1 cup of raw leafy vegetables,  cup of raw or cooked cut-up vegetables, or  cup of vegetable juice.  Fill one fourth of your plate with whole grains. Look for the word "whole"  as the first word in the ingredient list.  Fill one fourth of your plate with lean protein foods.  Eat 4-5 servings of fruit per day. A serving of fruit equals one medium whole fruit,  cup of dried fruit,  cup of fresh, frozen, or canned fruit, or  cup of 100% fruit juice.  Eat more foods that contain soluble fiber. Examples of foods that contain this type of fiber are apples, broccoli, carrots, beans, peas, and barley. Aim to get 20-30 g of fiber per day.  Eat more home-cooked food and less restaurant, buffet, and fast food.  Limit or avoid alcohol.  Limit foods that are high in starch and sugar.  Avoid fried foods.  Cook foods by using methods other than frying. Baking, boiling, grilling, and broiling are all great options. Other fat-reducing suggestions include: ? Removing the skin from poultry. ? Removing all visible fats from meats. ? Skimming the fat off of stews, soups, and gravies before serving them. ? Steaming vegetables in water or broth.  Lose weight if you are overweight. Losing just 5-10% of your initial body weight can help your overall health and prevent diseases such as diabetes and heart disease.  Increase your consumption of nuts, legumes, and seeds to 4-5 servings per week. One serving of dried beans or legumes equals  cup after being cooked, one serving of nuts equals 1 ounces, and one serving of seeds equals  ounce or 1 tablespoon.  You may need to monitor your salt (sodium) intake, especially if you have high blood pressure. Talk with your health care provider or dietitian to get  more information about reducing sodium. What foods can I eat? Grains  Breads, including [place], white, pita, wheat, raisin, rye, oatmeal, and [place]. Tortillas that are neither fried nor made with lard or trans fat. Low-fat rolls, including hotdog and hamburger buns and English muffins. Biscuits. Muffins. Waffles. Pancakes. Light popcorn. Whole-grain cereals. Flatbread. Melba  toast. Pretzels. Breadsticks. Rusks. Low-fat snacks and crackers, including oyster, saltine, matzo, graham, animal, and rye. Rice and pasta, including brown rice and those that are made with whole wheat. Vegetables All vegetables. Fruits All fruits, but limit coconut. Meats and Other Protein Sources Lean, well-trimmed beef, veal, pork, and lamb. Chicken and [place] without skin. All fish and shellfish. Wild duck, rabbit, pheasant, and venison. Egg whites or low-cholesterol egg substitutes. Dried beans, peas, lentils, and tofu.Seeds and most nuts. Dairy Low-fat or nonfat cheeses, including ricotta, string, and mozzarella. Skim or 1% milk that is liquid, powdered, or evaporated. Buttermilk that is made with low-fat milk. Nonfat or low-fat yogurt. Beverages Mineral water. Diet carbonated beverages. Sweets and Desserts Sherbets and fruit ices. Honey, jam, marmalade, jelly, and syrups. Meringues and gelatins. Pure sugar candy, such as hard candy, jelly beans, gumdrops, mints, marshmallows, and small amounts of dark chocolate. [employer] Eat all sweets and desserts in moderation. Fats and Oils Nonhydrogenated (trans-free) margarines. Vegetable oils, including soybean, sesame, sunflower, olive, peanut, safflower, corn, canola, and cottonseed. Salad dressings or mayonnaise that are made with a vegetable oil. Limit added fats and oils that you use for cooking, baking, salads, and as spreads. Other Cocoa powder. Coffee and tea. All seasonings and condiments. The items listed above may not be a complete list of recommended foods or beverages. Contact your dietitian for more options. What foods are not recommended? Grains Breads that are made with saturated or trans fats, oils, or whole milk. Croissants. Butter rolls. Cheese breads. Sweet rolls. Donuts. Buttered popcorn. Chow mein noodles. High-fat crackers, such as cheese or butter crackers. Meats and Other Protein Sources Fatty meats, such as  hotdogs, short ribs, sausage, spareribs, bacon, ribeye roast or steak, and mutton. High-fat deli meats, such as salami and bologna. Caviar. Domestic duck and goose. Organ meats, such as kidney, liver, sweetbreads, brains, gizzard, chitterlings, and heart. Dairy Cream, sour cream, cream cheese, and creamed cottage cheese. Whole milk cheeses, including blue (bleu), Monte[place] Jack, [place], [place], American, [place], Swiss, [place], [place], and [place]. Whole or 2% milk that is liquid, evaporated, or condensed. Whole buttermilk. Cream sauce or high-fat cheese sauce. Yogurt that is made from whole milk. Beverages Regular sodas and drinks with added sugar. Sweets and Desserts Frosting. Pudding. Cookies. Cakes other than angel food cake. Candy that has milk chocolate or white chocolate, hydrogenated fat, butter, coconut, or unknown ingredients. Buttered syrups. Full-fat ice cream or ice cream drinks. Fats and Oils Gravy that has suet, meat fat, or shortening. Cocoa butter, hydrogenated oils, palm oil, coconut oil, palm kernel oil. These can often be found in baked products, candy, fried foods, nondairy creamers, and whipped toppings. Solid fats and shortenings, including bacon fat, salt pork, lard, and butter. Nondairy cream substitutes, such as coffee creamers and sour cream substitutes. Salad dressings that are made of unknown oils, cheese, or sour cream. The items listed above may not be a complete list of foods and beverages to avoid. Contact your dietitian for more information. This information is not intended to replace advice given to you by your health care provider. Make sure you discuss any questions you have with your health care  provider. Document Released: 11/17/2007 Document Revised: 08/28/2015 Document Reviewed: 08/01/2013 Elsevier Interactive Patient Education  2017 Elsevier Inc.  Montelukast oral tablets What is this medicine? MONTELUKAST (mon te LOO kast) is used to prevent and treat the  symptoms of asthma. It is also used to treat allergies. Do not use for an acute asthma attack. This medicine may be used for other purposes; ask your health care provider or pharmacist if you have questions. COMMON BRAND NAME(S): Singulair What should I tell my health care provider before I take this medicine? They need to know if you have any of these conditions: -liver disease -an unusual or allergic reaction to montelukast, other medicines, foods, dyes, or preservatives -pregnant or trying to get pregnant -breast-feeding How should I use this medicine? This medicine should be given by mouth. Follow the directions on the prescription label. Take this medicine at the same time every day. You may take this medicine with or without meals. Do not chew the tablets. Do not stop taking your medicine unless your doctor tells you to. Talk to your pediatrician regarding the use of this medicine in children. Special care may be needed. While this drug may be prescribed for children as young as 17 years of age for selected conditions, precautions do apply. Overdosage: If you think you have taken too much of this medicine contact a poison control center or emergency room at once. NOTE: This medicine is only for you. Do not share this medicine with others. What if I miss a dose? If you miss a dose, take it as soon as you can. If it is almost time for your next dose, take only that dose. Do not take double or extra doses. What may interact with this medicine? -anti-infectives like rifampin and rifabutin -medicines for diabetes like rosiglitazone and repaglinide -medicines for seizures like phenytoin, phenobarbital, and carbamazepine -paclitaxel This list may not describe all possible interactions. Give your health care provider a list of all the medicines, herbs, non-prescription drugs, or dietary supplements you use. Also tell them if you smoke, drink alcohol, or use illegal drugs. Some items may interact  with your medicine. What should I watch for while using this medicine? Visit your doctor or health care professional for regular checks on your progress. Tell your doctor or health care professional if your allergy or asthma symptoms do not improve. Take your medicine even when you do not have symptoms. Do not stop taking any of your medicine(s) unless your doctor tells you to. If you have asthma, talk to your doctor about what to do in an acute asthma attack. Always have your inhaled rescue medicine for asthma attacks with you. Patients and their families should watch for new or worsening thoughts of suicide or depression. Also watch for sudden changes in feelings such as feeling anxious, agitated, panicky, irritable, hostile, aggressive, impulsive, severely restless, overly excited and hyperactive, or not being able to sleep. Any worsening of mood or thoughts of suicide or dying should be reported to your health care professional right away. What side effects may I notice from receiving this medicine? Side effects that you should report to your doctor or health care professional as soon as possible: -allergic reactions like skin rash or hives, or swelling of the face, lips, or tongue -breathing problems -confusion -dark urine -fever or infection -flu-like symptoms -hallucinations -painful lumps under the skin -pain, tingling, numbness in the hands or feet -sinus pain or swelling -suicidal thoughts or other mood changes -tremors -trouble sleeping -  uncontrolled muscle movements -unusual bleeding or bruising -yellowing of the eyes or skin Side effects that usually do not require medical attention (report to your doctor or health care professional if they continue or are bothersome): -cough -dizziness -drowsiness -headache -nightmares -stomach upset -stuffy nose This list may not describe all possible side effects. Call your doctor for medical advice about side effects. You may report  side effects to FDA at 1-800-FDA-1088. Where should I keep my medicine? Keep out of the reach of children. Store at room temperature between 15 and 30 degrees C (59 and 86 degrees F). Protect from light and moisture. Keep this medicine in the original bottle. Throw away any unused medicine after the expiration date. NOTE: This sheet is a summary. It may not cover all possible information. If you have questions about this medicine, talk to your doctor, pharmacist, or health care provider.  2018 Elsevier/Gold Standard (2015-02-09 09:40:44)  Great job on reduction in A1c- 6.3 today. We will call you when lab results and knee culture results are available. Please continue all medications as directed. Refills sent in for Albuterol and Escitalopram (Lexapro). Two New Medications: Montelukast (asthma) and Doxycycline (knee abscess). Please call clinic in 3 weeks and let us know how you are feeling on the Escitalopram-if no side effects we will refill medication. Please follow-up in 3 months for complete physical. NICE TO SEE YOU!

## 2016-11-15 NOTE — Assessment & Plan Note (Addendum)
Great job on reduction in A1c- 6.3 today. We will call you when lab results and knee culture results are available. Please continue all medications as directed. Refills sent in for Albuterol and Escitalopram (Lexapro). Two New Medications: Montelukast (asthma) and Doxycycline (knee abscess). Please follow-up in 3 months for complete physical.

## 2016-11-15 NOTE — Assessment & Plan Note (Signed)
A1c today 6.3, improved from 7.8 on 09/01/2016. Continue Metformin  BID and heart healthy diet.

## 2016-11-15 NOTE — Assessment & Plan Note (Signed)
Knee cleaned, culture obtained. Started on Doxcycline  BID x 10 days He has hx of axillary abscesses.

## 2016-11-15 NOTE — Progress Notes (Signed)
Subjective:    Patient ID: Devin [name], male    DOB: 1970-06-01, 46 y.o.   MRN: [id number]  09/01/2016 OV Notes: Diabetes  Pertinent negatives for hypoglycemia include no confusion, headaches, nervousness/anxiousness, pallor or tremors. Associated symptoms include fatigue. Pertinent negatives for diabetes include no chest pain, no polydipsia, no polyphagia, no polyuria and no weakness.  Depression         Associated symptoms include fatigue.  Associated symptoms include no decreased concentration, no appetite change, no myalgias, no headaches and no suicidal ideas. :  Devin [name] is here to establish as a new pt.  He is a very pleasant 46 year old male.  PMH:  Depression, insomnia, and T2D.  He was recently treated for SOB/bronchospasm/dyspnea June/July at local ED.  He denies hx of asthma, however works in extreme heat at [employer] (he works 2nd shift [id number]).  He has completed course of prednisone and only needs to use rescue inhalers 2-3 times/week now.  He reports depression began after mother passed away 9562 from complications of Alzheimers.  He has appt next week with [facility] to re-establish with counselor and re-start escitalopram 65m.  He treats his insomnia with Trazadone 1075m reports needing medication a few nights of the week.  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  He lost > 40lbs when he started working at [employer] then came off medication.  He estimates to drink > galloon water/day.  He eats an "ok" diet and denies tobacco/ETOH use.  He was adopted, however reports that his birth parents were both diabetic.   Today's OV 11/15/2016: Mr. Devin [name] here for regular f/u: HTN, T2D, Depression, Insomnia, and Asthma.  He reports medication compliance and denies SE (no myalgia's r/t statin therapy specifically).  He has been reducing CHO/saturated fat in diet and denies episodes of hypoglycemia.  He has  not re-established with another mental health care provider (referral for CBT placed 10/06/16) and he would like to re-started on Escitalopram 2028maily-he denies thoughts or harming himself/others, he feels "stressed with a lot going on in life".  He works FT at [employer] performs strenuous lifting/bending/loading/unloading for >6 hrs per shift. He was seen at ED 10/25/2016 for asthma exacerbation and we discussed starting him on Montelukast 51m37mily to reduce flare's.  He estimates to use Albuterol 1-2 times/week.  He continues to abstain from tobacco/ETOH use.  He reports "decent sleep with that Trazodone". He has one acute complaint: L knee pain r/t to  abscess on patella that developed 11/11/2016.  Pain 8/10, worsens with movement/use.  He has not taken anything to treat sx's.  He denies fever/night sweats/malaise.  He has hx of axillary abscess that have required ABX treatment.   Patient Care Team: DanfEsaw Grandchild as PCP - General (Family Medicine)  Patient Active Problem List   Diagnosis Date Noted  . Abscess of knee, left 11/15/2016  . Bedbug bite 10/06/2016  . Type 2 diabetes mellitus without complication, without long-term current use of insulin (HCC)[place]/16/2018  . Hyperlipidemia associated with type 2 diabetes mellitus (HCC)[place]/16/2018  . Insomnia 09/01/2016  . Routine health maintenance 09/01/2016  . Depression, recurrent (HCC)[place]/31/2017  . Major depressive disorder, recurrent episode, moderate (HCC)[place]/31/2017    Past Medical History:  Diagnosis Date  . Asthma   . Depression   . Diabetes mellitus without complication (HCC)[place]. Insomnia  Past Surgical History:  Procedure Laterality Date  . EYE SURGERY     Cataract removed  . TONSILLECTOMY      Family History  Problem Relation Age of Onset  . Cancer Mother   . Alzheimer's disease Mother   . Cancer Father   . Hypertension Father     Social History  Substance Use Topics  . Smoking status: Never Smoker  .  Smokeless tobacco: Never Used  . Alcohol use No   Counseling given: Not Answered   Outpatient Encounter Prescriptions as of 11/15/2016  Medication Sig  . albuterol (PROVENTIL HFA;VENTOLIN HFA) 108 (90 Base) MCG/ACT inhaler Inhale 2 puffs into the lungs every 4 (four) hours as needed for wheezing or shortness of breath.  Marland Kitchen atorvastatin (LIPITOR) 10 MG tablet Take 1 tablet (10 mg total) by mouth daily.  . blood glucose meter kit and supplies KIT Dispense based on patient and insurance preference. Use up to four times daily as directed. DX E11.9  . escitalopram (LEXAPRO) 20 MG tablet Take 1 tablet (20 mg total) by mouth daily.  . Glucose Blood (BLOOD GLUCOSE TEST STRIPS) STRP Test four times a day as needed.  DX E11.9  . Lancets MISC Test four times daily as needed.  DX E11.9  . metFORMIN (GLUCOPHAGE) 500 MG tablet Take 1 tablet (500 mg total) by mouth 2 (two) times daily with a meal.  . traZODone (DESYREL) 100 MG tablet Take 1 tablet (100 mg total) by mouth at bedtime as needed for sleep.  . Vitamin D, Ergocalciferol, (DRISDOL) 50000 units CAPS capsule Take 1 capsule (50,000 Units total) by mouth every 7 (seven) days.  . [DISCONTINUED] albuterol (PROVENTIL HFA;VENTOLIN HFA) 108 (90 BASE) MCG/ACT inhaler Inhale 2 puffs into the lungs every 4 (four) hours as needed for wheezing or shortness of breath.  . [DISCONTINUED] albuterol (PROVENTIL HFA;VENTOLIN HFA) 108 (90 Base) MCG/ACT inhaler Inhale 4 puffs into the lungs every 4 (four) hours as needed for wheezing or shortness of breath (every 4 hours).  . [DISCONTINUED] escitalopram (LEXAPRO) 20 MG tablet Take 1 tablet (20 mg total) by mouth daily.  . [DISCONTINUED] escitalopram (LEXAPRO) 20 MG tablet Take 1 tablet (20 mg total) by mouth daily.  . [DISCONTINUED] triamcinolone cream (KENALOG) 0.1 % Apply 1 application topically 2 (two) times daily.  Marland Kitchen doxycycline (VIBRA-TABS) 100 MG tablet Take 1 tablet (100 mg total) by mouth 2 (two) times daily.  .  montelukast (SINGULAIR) 10 MG tablet Take 1 tablet (10 mg total) by mouth at bedtime.   No facility-administered encounter medications on file as of 11/15/2016.     No Known Allergies   Review of Systems  Constitutional: Positive for fatigue. Negative for activity change, appetite change, chills, diaphoresis, fever and unexpected weight change.  HENT: Negative for congestion, postnasal drip, sinus pain, sinus pressure, sneezing and sore throat.   Eyes: Negative for visual disturbance.  Respiratory: Negative for cough, chest tightness, shortness of breath, wheezing and stridor.   Cardiovascular: Negative for chest pain, palpitations and leg swelling.  Gastrointestinal: Negative for abdominal distention, abdominal pain, blood in stool, constipation, diarrhea, nausea and vomiting.  Endocrine: Negative for cold intolerance, heat intolerance, polydipsia, polyphagia and polyuria.  Genitourinary: Negative for difficulty urinating, flank pain and hematuria.  Musculoskeletal: Positive for arthralgias and joint swelling. Negative for back pain, gait problem, myalgias, neck pain and neck stiffness.       L knee r/t abscess  Skin: Negative for color change, pallor and wound.  Allergic/Immunologic: Negative for  immunocompromised state.  Neurological: Negative for tremors, weakness, numbness and headaches.  Hematological: Does not bruise/bleed easily.  Psychiatric/Behavioral: Positive for depression, dysphoric mood and sleep disturbance. Negative for agitation, behavioral problems, confusion, decreased concentration, hallucinations, self-injury and suicidal ideas. The patient is not nervous/anxious and is not hyperactive.        Objective:   Physical Exam  Constitutional: He is oriented to person, place, and time. He appears well-developed and well-nourished. No distress.  HENT:  Head: Normocephalic and atraumatic.  Right Ear: External ear normal.  Left Ear: External ear normal.  Nose: Nose  normal.  Mouth/Throat: Oropharynx is clear and moist. No oropharyngeal exudate.  Eyes: Pupils are equal, round, and reactive to light. Conjunctivae are normal.  Neck: Normal range of motion. Neck supple.  Cardiovascular: Normal rate, regular rhythm, normal heart sounds and intact distal pulses.   No murmur heard. Pulmonary/Chest: Effort normal and breath sounds normal. No respiratory distress. He has no wheezes. He has no rales. He exhibits no tenderness.  Musculoskeletal: Normal range of motion. He exhibits edema and tenderness.       Left knee: He exhibits swelling. He exhibits normal range of motion and normal patellar mobility.       Legs: Red, warm abscess noted over L patella area.  3 discrete area's of "purulent centers" noted.  No streaking or active drainage noted.  Lymphadenopathy:    He has no cervical adenopathy.  Neurological: He is alert and oriented to person, place, and time. Coordination normal.  Skin: Skin is warm and dry. No rash noted. He is not diaphoretic. No erythema. No pallor.  Psychiatric: He has a normal mood and affect. His behavior is normal. Judgment and thought content normal.  Nursing note and vitals reviewed.         Assessment & Plan:   1. Type 2 diabetes mellitus without complication, without long-term current use of insulin ([place])   2. Hyperlipidemia associated with type 2 diabetes mellitus ([place])   3. Abscess   4. Abscess of knee, left   5. Routine health maintenance   6. Depression, recurrent ([place])     Routine health maintenance Great job on reduction in A1c- 6.3 today. We will call you when lab results and knee culture results are available. Please continue all medications as directed. Refills sent in for Albuterol and Escitalopram (Lexapro). Two New Medications: Montelukast (asthma) and Doxycycline (knee abscess). Please follow-up in 3 months for complete physical.  Depression, recurrent (HCC) Re-started on Escitalopram 14m  daily. Please call clinic in 3 weeks and let u[place]ore[place]now how you are feeling on the Escitalopram-if no side effects we will refill medication. CBT referral placed 10/06/16- strongly encouraged to establish with mental health care provider.   Abscess of knee, left Knee cleaned, culture obtained. Started on Doxcycline 1077mBID x 10 days He has hx of axillary abscesses.   Type 2 diabetes mellitus without complication, without long-term current use of insulin (HCC) A1c today 6.3, improved from 7.8 on 09/01/2016. Continue Metformin 50026mID and heart healthy diet.   Hyperlipidemia associated with type 2 diabetes mellitus (HCCHillsdaleoderate-intensity statin started 09/05/2016 due to T2D. He continues to abstain from ETOH and denies myalgia's. Lipids re-checked today, will run ASCVD Risk Estimator once results available.    Follow up in 3 months for CPE KatEsaw GrandchildP

## 2016-11-16 LAB — LIPID PANEL
CHOL/HDL RATIO: 2.7 ratio (ref 0.0–5.0)
CHOLESTEROL TOTAL: 95 mg/dL — AB (ref 100–199)
HDL: 35 mg/dL — ABNORMAL LOW (ref >39)
LDL CALC: 41 mg/dL (ref 0–99)
TRIGLYCERIDES: 97 mg/dL (ref 0–149)
VLDL CHOLESTEROL CAL: 19 mg/dL (ref 5–40)

## 2016-11-16 LAB — ALT: ALT: 11 IU/L (ref 0–44)

## 2016-11-17 LAB — WOUND CULTURE: Organism ID, Bacteria: NONE SEEN

## 2016-11-18 ENCOUNTER — Other Ambulatory Visit: Payer: Self-pay | Admitting: Adult Health

## 2016-11-18 DIAGNOSIS — E1169 Type 2 diabetes mellitus with other specified complication: Secondary | ICD-10-CM

## 2016-11-18 DIAGNOSIS — E785 Hyperlipidemia, unspecified: Principal | ICD-10-CM

## 2016-11-18 MED ORDER — ATORVASTATIN CALCIUM 10 MG PO TABS: ORAL_TABLET | ORAL | 3 refills | Status: AC

## 2016-12-08 ENCOUNTER — Ambulatory Visit: Payer: Self-pay | Admitting: Adult Health

## 2016-12-12 LAB — HM DIABETES EYE EXAM

## 2016-12-13 ENCOUNTER — Ambulatory Visit: Payer: Self-pay | Admitting: Adult Health

## 2016-12-14 ENCOUNTER — Other Ambulatory Visit: Payer: Self-pay | Admitting: Adult Health

## 2016-12-14 NOTE — Telephone Encounter (Signed)
Pt was to have called to update us on how the Lexapro is working before any refills could be authorized.  Pt has been a no show for his last 2 appointments.  LVM for pt to call to discuss before refilling this medication.  Devin [name]. Nelson, CMA

## 2016-12-15 NOTE — Telephone Encounter (Signed)
LVM for pt to call to discuss before we will authorize refills.  Tiajuana Amass. Oletta Buehring, CMA

## 2016-12-16 NOTE — Telephone Encounter (Signed)
LVM for pt to call to discuss medication refill.  Advised pt that I have previously left 2 other messages and he has not returned my call.  Advised pt that this is my final message and that if we do not hear back from by 12/19/16 at 5pm, then refill will be denied.  Tiajuana Amass. Nelson, CMA

## 2016-12-20 NOTE — Telephone Encounter (Signed)
Pt never returned calls, therefore, refill denied per William HamburgerKaty Danford, NP.  Tiajuana Amass. Alecsander Hattabaugh, CMA

## 2016-12-28 ENCOUNTER — Ambulatory Visit: Payer: Self-pay | Admitting: Adult Health

## 2016-12-28 ENCOUNTER — Other Ambulatory Visit: Payer: Self-pay | Admitting: Adult Health

## 2016-12-28 NOTE — Progress Notes (Deleted)
Subjective:    Patient ID: Devin [name], male    DOB: 08/25/1970, 46 y.o.   MRN: [id number]  09/01/2016 OV Notes: Diabetes  Pertinent negatives for hypoglycemia include no confusion, headaches, nervousness/anxiousness, pallor or tremors. Associated symptoms include fatigue. Pertinent negatives for diabetes include no chest pain, no polydipsia, no polyphagia, no polyuria and no weakness.  Depression         Associated symptoms include fatigue.  Associated symptoms include no decreased concentration, no appetite change, no myalgias, no headaches and no suicidal ideas. :  Devin [name] is here to establish as a new pt.  He is a very pleasant 46 year old male.  PMH:  Depression, insomnia, and T2D.  He was recently treated for SOB/bronchospasm/dyspnea June/July at local ED.  He denies hx of asthma, however works in extreme heat at [employer] (he works 2nd shift 1600-2300).  He has completed course of prednisone and only needs to use rescue inhalers 2-3 times/week now.  He reports depression began after mother passed away 2574 from complications of Alzheimers.  He has appt next week with [facility] to re-establish with counselor and re-start escitalopram 66m.  He treats his insomnia with Trazadone 1049m reports needing medication a few nights of the week.  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  He lost > 40lbs when he started working at [employer] then came off medication.  He estimates to drink > galloon water/day.  He eats an "ok" diet and denies tobacco/ETOH use.  He was adopted, however reports that his birth parents were both diabetic.   Today's OV 11/15/2016: Devin [name] here for regular f/u: HTN, T2D, Depression, Insomnia, and Asthma.  He reports medication compliance and denies SE (no myalgia's r/t statin therapy specifically).  He has been reducing CHO/saturated fat in diet and denies episodes of hypoglycemia.  He has  not re-established with another mental health care provider (referral for CBT placed 10/06/16) and he would like to re-started on Escitalopram 2070maily-he denies thoughts or harming himself/others, he feels "stressed with a lot going on in life".  He works FT at [employer] performs strenuous lifting/bending/loading/unloading for >6 hrs per shift. He was seen at ED 10/25/2016 for asthma exacerbation and we discussed starting him on Montelukast 61m75mily to reduce flare's.  He estimates to use Albuterol 1-2 times/week.  He continues to abstain from tobacco/ETOH use.  He reports "decent sleep with that Trazodone". He has one acute complaint: L knee pain r/t to  abscess on patella that developed 11/11/2016.  Pain 8/10, worsens with movement/use.  He has not taken anything to treat sx's.  He denies fever/night sweats/malaise.  He has hx of axillary abscess that have required ABX treatment.   Today's OV Notes 12/28/2016:   Patient Care Team: DanfEsaw Grandchild as PCP - General (Family Medicine)  Patient Active Problem List   Diagnosis Date Noted  . Abscess of knee, left 11/15/2016  . Bedbug bite 10/06/2016  . Type 2 diabetes mellitus without complication, without long-term current use of insulin (HCC)[place]/16/2018  . Hyperlipidemia associated with type 2 diabetes mellitus (HCC)[place]/16/2018  . Insomnia 09/01/2016  . Routine health maintenance 09/01/2016  . Depression, recurrent (HCC)[place]/31/2017  . Major depressive disorder, recurrent episode, moderate (HCC)[place]/31/2017    Past Medical History:  Diagnosis Date  . Asthma   . Depression   . Diabetes mellitus without  complication ([place])   . Insomnia     Past Surgical History:  Procedure Laterality Date  . EYE SURGERY     Cataract removed  . TONSILLECTOMY      Family History  Problem Relation Age of Onset  . Cancer Mother   . Alzheimer's disease Mother   . Cancer Father   . Hypertension Father     Social History   Tobacco Use  .  Smoking status: Never Smoker  . Smokeless tobacco: Never Used  Substance Use Topics  . Alcohol use: No  . Drug use: No   Counseling given: Not Answered   Outpatient Encounter Medications as of 12/28/2016  Medication Sig  . albuterol (PROVENTIL HFA;VENTOLIN HFA) 108 (90 Base) MCG/ACT inhaler Inhale 2 puffs into the lungs every 4 (four) hours as needed for wheezing or shortness of breath.  Marland Kitchen atorvastatin (LIPITOR) 10 MG tablet 1/2 tablet once a day.  . blood glucose meter kit and supplies KIT Dispense based on patient and insurance preference. Use up to four times daily as directed. DX E11.9  . doxycycline (VIBRA-TABS) 100 MG tablet Take 1 tablet (100 mg total) by mouth 2 (two) times daily.  Marland Kitchen escitalopram (LEXAPRO) 20 MG tablet Take 1 tablet (20 mg total) by mouth daily.  . Glucose Blood (BLOOD GLUCOSE TEST STRIPS) STRP Test four times a day as needed.  DX E11.9  . Lancets MISC Test four times daily as needed.  DX E11.9  . metFORMIN (GLUCOPHAGE) 500 MG tablet Take 1 tablet (500 mg total) by mouth 2 (two) times daily with a meal.  . montelukast (SINGULAIR) 10 MG tablet Take 1 tablet (10 mg total) by mouth at bedtime.  . traZODone (DESYREL) 100 MG tablet Take 1 tablet (100 mg total) by mouth at bedtime as needed for sleep.  . Vitamin D, Ergocalciferol, (DRISDOL) 50000 units CAPS capsule Take 1 capsule (50,000 Units total) by mouth every 7 (seven) days.   No facility-administered encounter medications on file as of 12/28/2016.     No Known Allergies   Review of Systems  Constitutional: Positive for fatigue. Negative for activity change, appetite change, chills, diaphoresis, fever and unexpected weight change.  HENT: Negative for congestion, postnasal drip, sinus pressure, sinus pain, sneezing and sore throat.   Eyes: Negative for visual disturbance.  Respiratory: Negative for cough, chest tightness, shortness of breath, wheezing and stridor.   Cardiovascular: Negative for chest pain,  palpitations and leg swelling.  Gastrointestinal: Negative for abdominal distention, abdominal pain, blood in stool, constipation, diarrhea, nausea and vomiting.  Endocrine: Negative for cold intolerance, heat intolerance, polydipsia, polyphagia and polyuria.  Genitourinary: Negative for difficulty urinating, flank pain and hematuria.  Musculoskeletal: Positive for arthralgias and joint swelling. Negative for back pain, gait problem, myalgias, neck pain and neck stiffness.       L knee r/t abscess  Skin: Negative for color change, pallor and wound.  Allergic/Immunologic: Negative for immunocompromised state.  Neurological: Negative for tremors, weakness, numbness and headaches.  Hematological: Does not bruise/bleed easily.  Psychiatric/Behavioral: Positive for depression, dysphoric mood and sleep disturbance. Negative for agitation, behavioral problems, confusion, decreased concentration, hallucinations, self-injury and suicidal ideas. The patient is not nervous/anxious and is not hyperactive.        Objective:   Physical Exam  Constitutional: He is oriented to person, place, and time. He appears well-developed and well-nourished. No distress.  HENT:  Head: Normocephalic and atraumatic.  Right Ear: External ear normal.  Left Ear: External ear normal.  Nose: Nose normal.  Mouth/Throat: Oropharynx is clear and moist. No oropharyngeal exudate.  Eyes: Conjunctivae are normal. Pupils are equal, round, and reactive to light.  Neck: Normal range of motion. Neck supple.  Cardiovascular: Normal rate, regular rhythm, normal heart sounds and intact distal pulses.  No murmur heard. Pulmonary/Chest: Effort normal and breath sounds normal. No respiratory distress. He has no wheezes. He has no rales. He exhibits no tenderness.  Musculoskeletal: Normal range of motion.       Left knee: He exhibits swelling. He exhibits normal range of motion and normal patellar mobility.       Legs: Lymphadenopathy:      He has no cervical adenopathy.  Neurological: He is alert and oriented to person, place, and time. Coordination normal.  Skin: Skin is warm and dry. No rash noted. He is not diaphoretic. No erythema. No pallor.  Psychiatric: He has a normal mood and affect. His behavior is normal. Judgment and thought content normal.  Nursing note and vitals reviewed.         Assessment & Plan:   No diagnosis found.  No problem-specific Assessment & Plan notes found for this encounter.    Follow up in 3 months for CPE Esaw Grandchild, NP

## 2017-01-04 ENCOUNTER — Other Ambulatory Visit: Payer: Self-pay | Admitting: Adult Health

## 2017-02-22 ENCOUNTER — Ambulatory Visit: Payer: Self-pay | Admitting: Adult Health

## 2017-02-23 ENCOUNTER — Ambulatory Visit: Payer: Self-pay | Admitting: Adult Health

## 2017-09-06 ENCOUNTER — Other Ambulatory Visit: Payer: Self-pay | Admitting: Adult Health

## 2018-07-03 ENCOUNTER — Other Ambulatory Visit: Payer: Self-pay | Admitting: Adult Health

## 2018-11-21 IMAGING — DX DG CHEST 2V
2 series · 2 of 2 positions shown · non-contrast
Comparison: Chest radiograph performed 08/03/2014

CLINICAL DATA: Acute onset of shortness of breath and wheezing.
Initial encounter.

EXAM:
CHEST  2 VIEW

[chest pa]
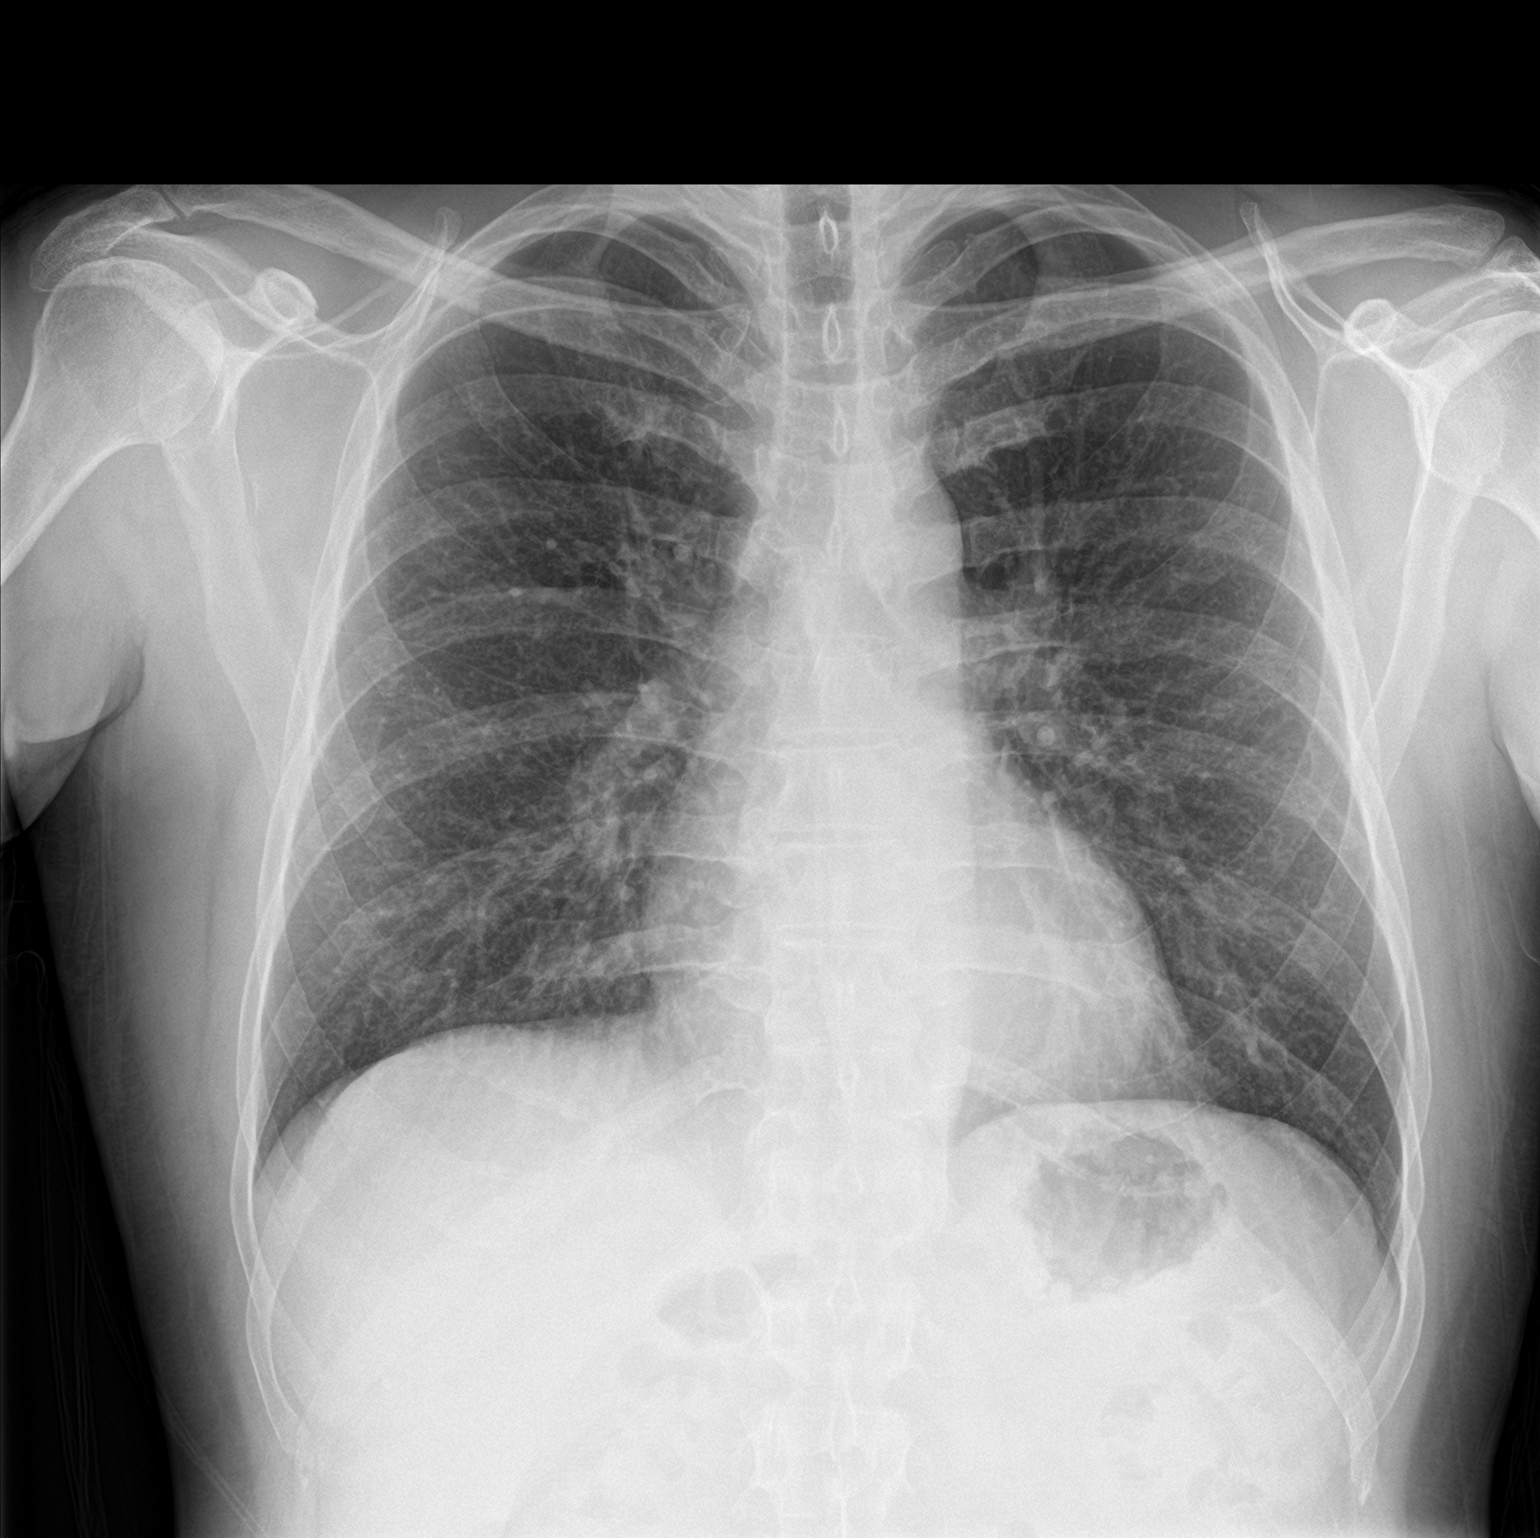

[chest lat]
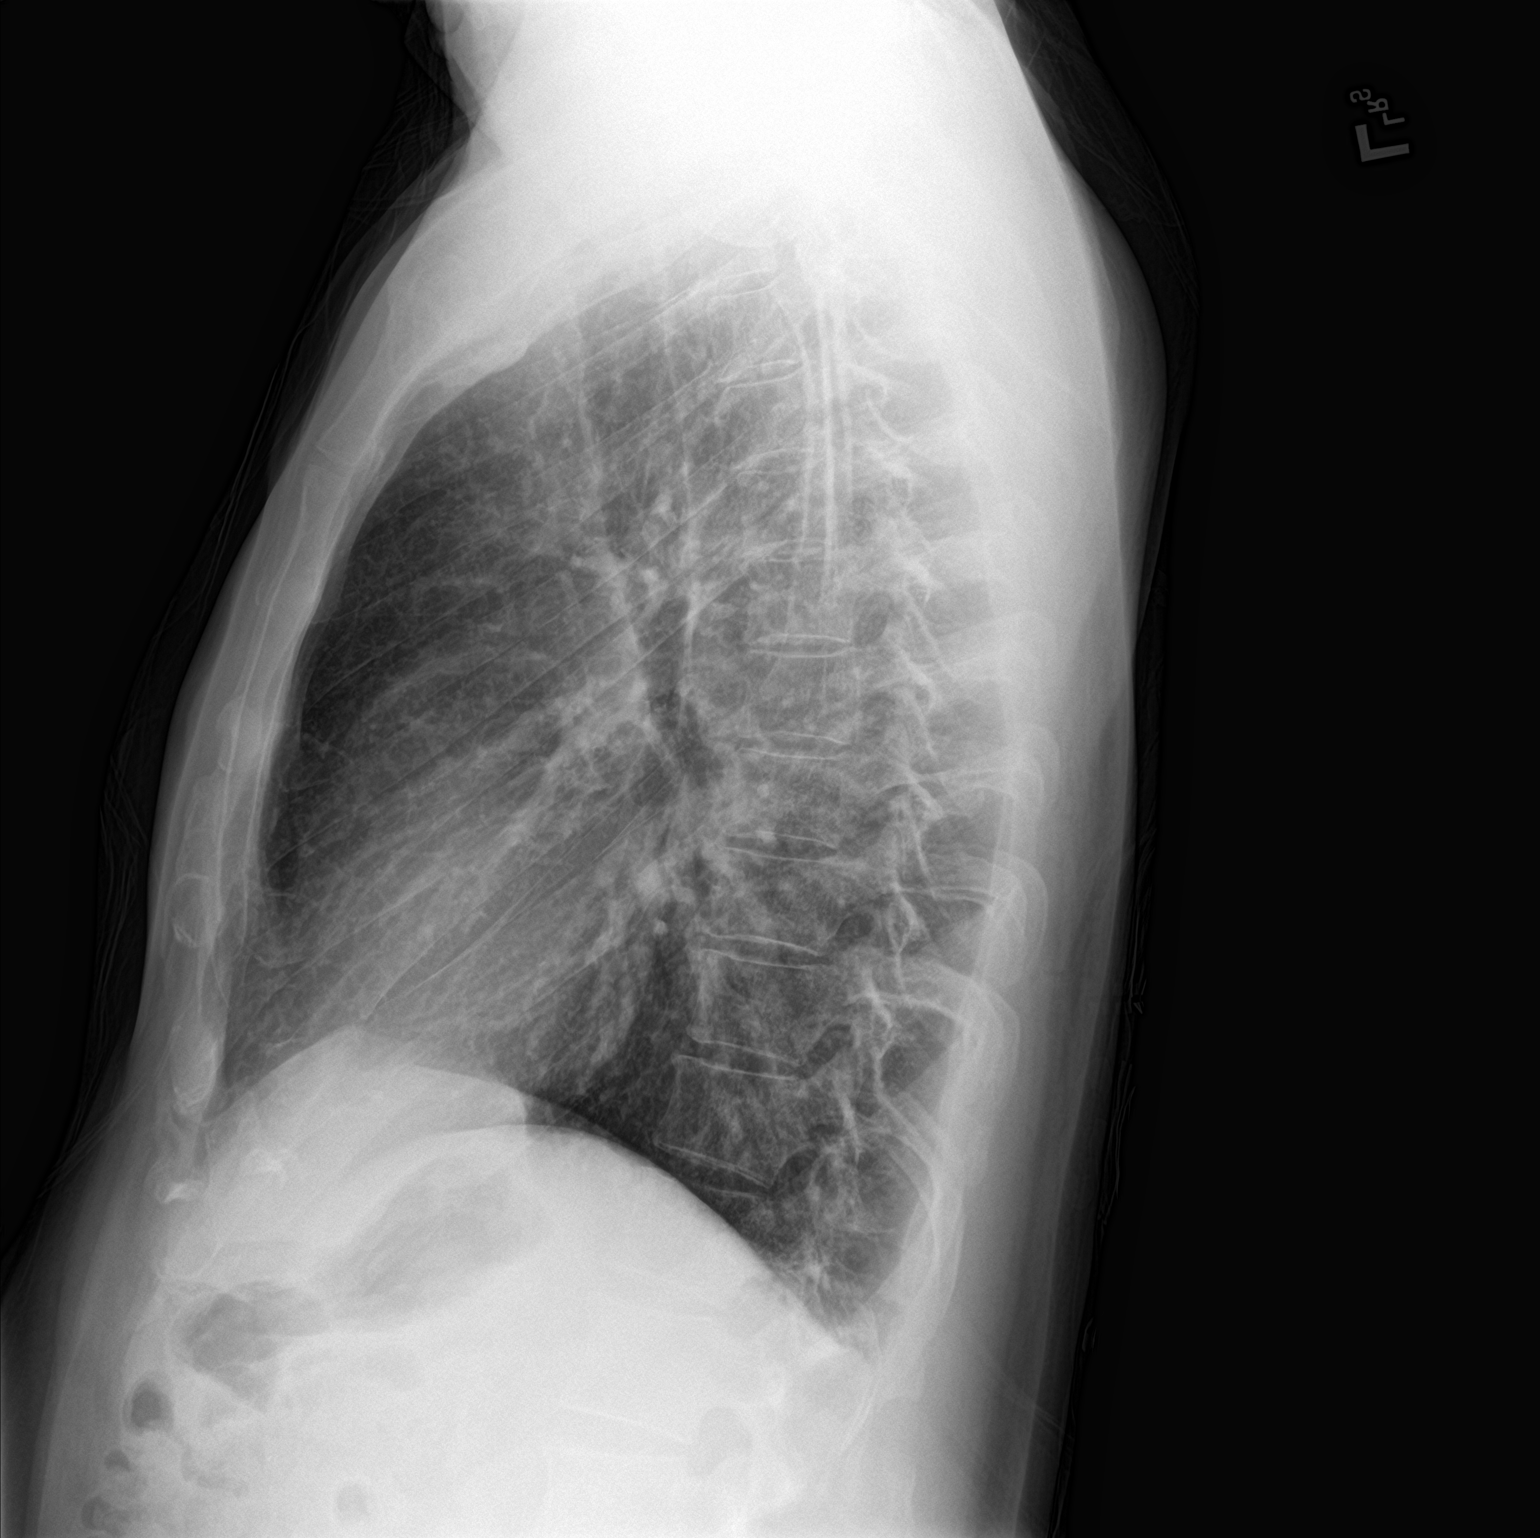

[2 of 2 positions shown; findings below may reference images not displayed]

FINDINGS: The lungs are well-aerated. Peribronchial thickening is noted. There
is no evidence of focal opacification, pleural effusion or
pneumothorax.

The heart is normal in size; the mediastinal contour is within
normal limits. No acute osseous abnormalities are seen.
IMPRESSION: Peribronchial thickening noted.  Lungs otherwise grossly clear.

## 2018-12-13 IMAGING — DX DG CHEST 2V
2 series · 2 of 2 positions shown · non-contrast
Comparison: Radiographs 08/03/2016

CLINICAL DATA: Shortness of breath.  Wheezing.  History of asthma.

EXAM:
CHEST  2 VIEW

[chest pa]
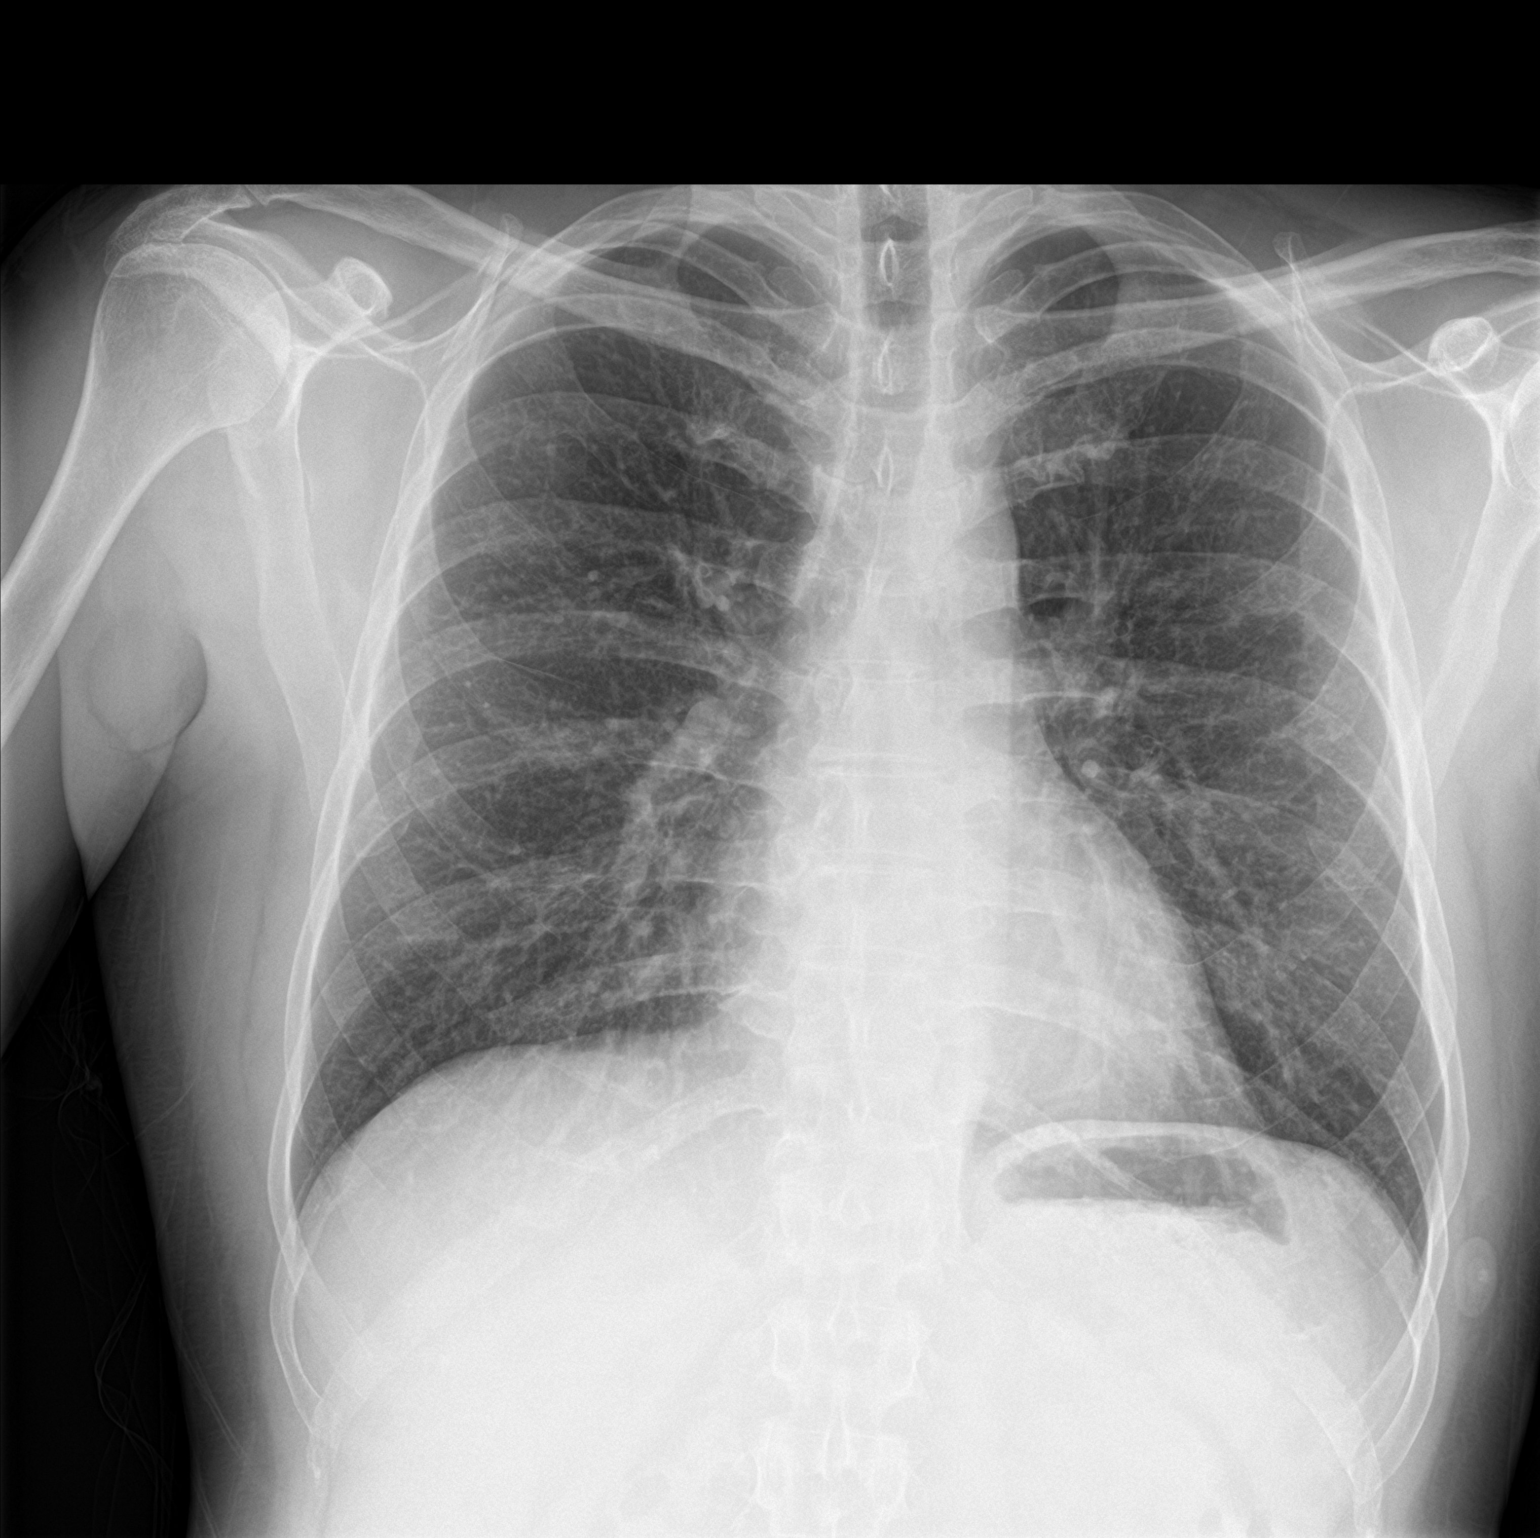

[chest lat]
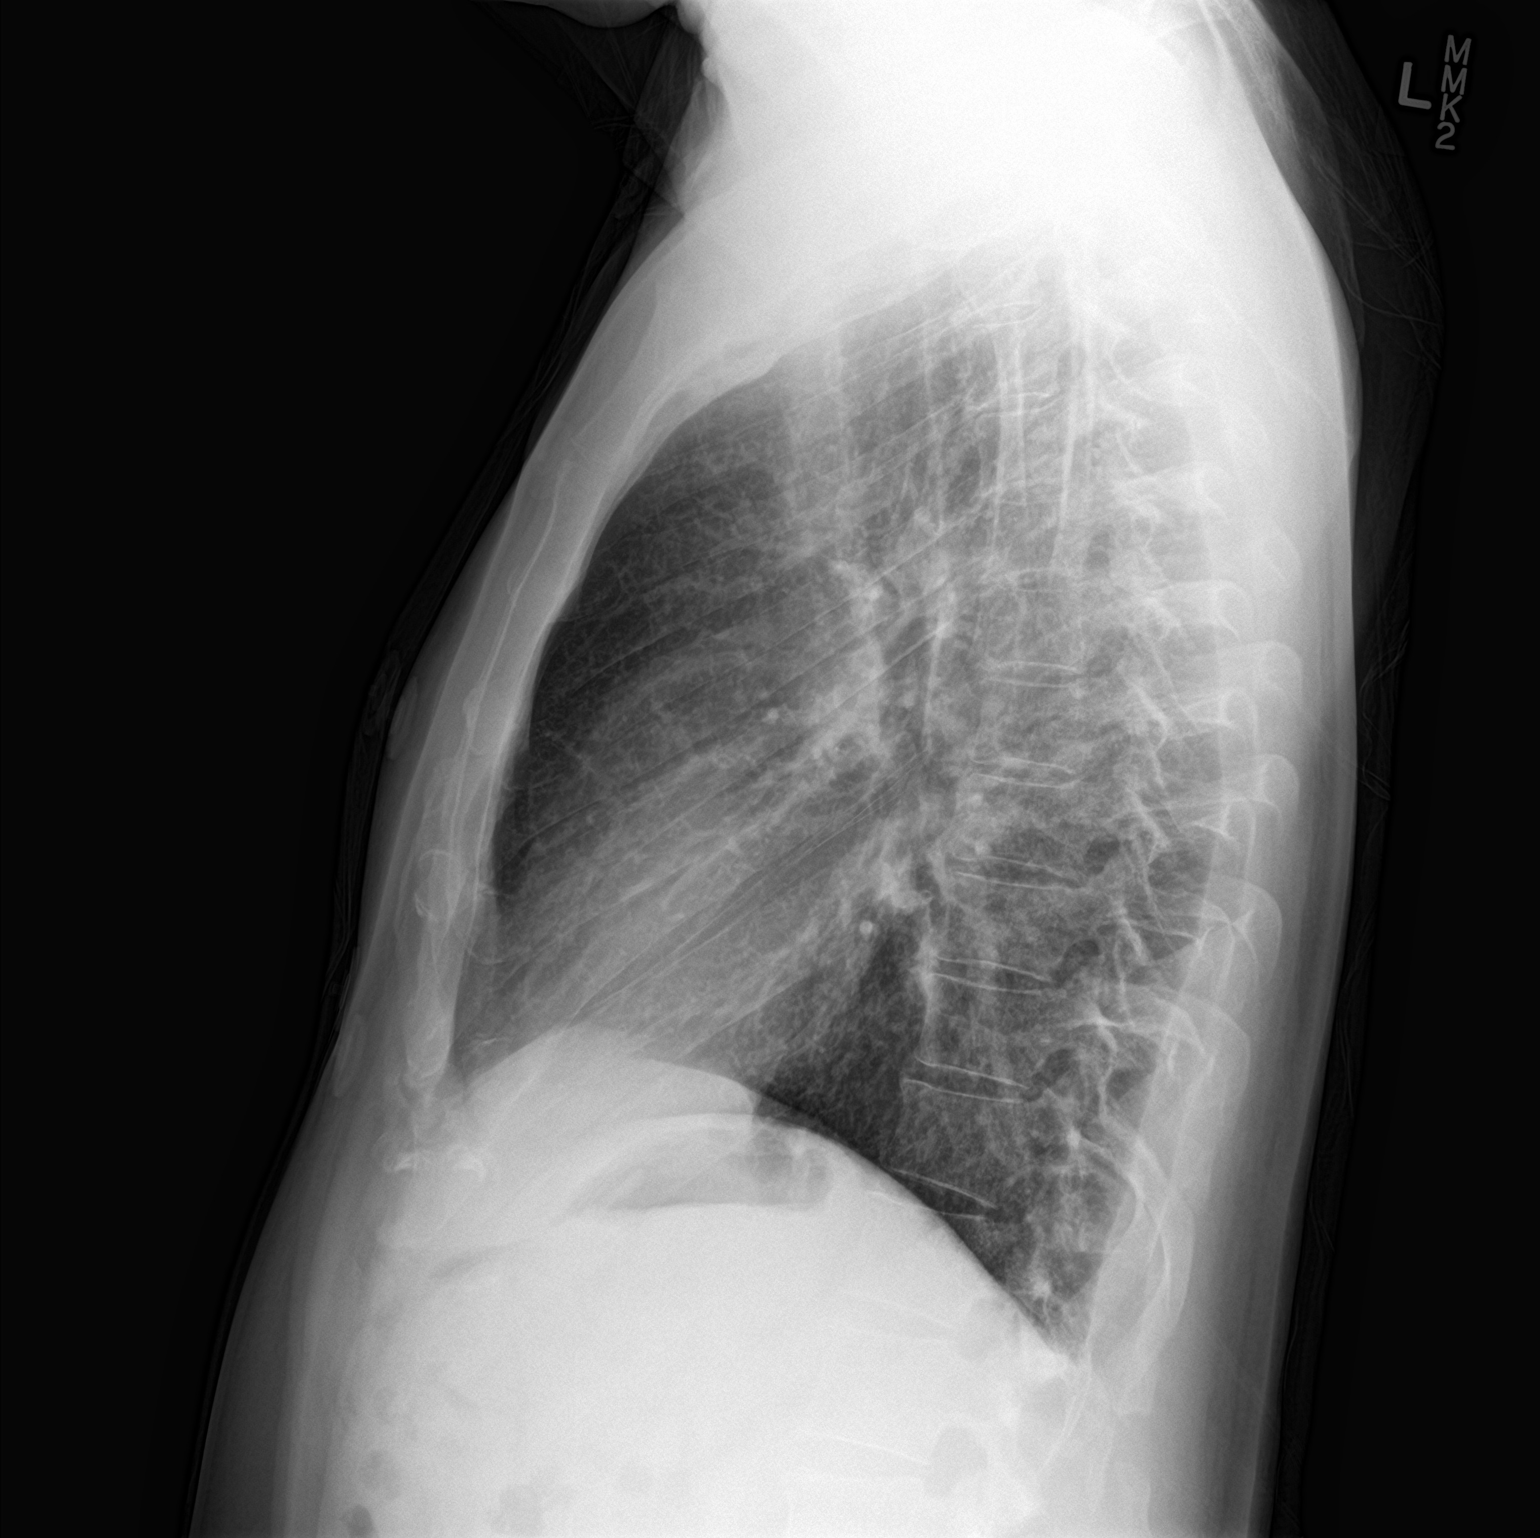

[2 of 2 positions shown; findings below may reference images not displayed]

FINDINGS: Stable bronchial thickening. The cardiomediastinal contours are
normal. The lungs are clear. Pulmonary vasculature is normal. No
consolidation, pleural effusion, or pneumothorax. No acute osseous
abnormalities are seen.
IMPRESSION: Stable bronchial thickening consistent with asthma/reactive airways
disease or bronchitis. No localizing or acute abnormality.

## 2024-02-17 ENCOUNTER — Ambulatory Visit: Admission: EM | Admit: 2024-02-17 | Discharge: 2024-02-17 | Disposition: A | Discharge status: Home / Self Care

## 2024-02-17 ENCOUNTER — Encounter: Payer: Self-pay | Admitting: *Deleted

## 2024-02-17 DIAGNOSIS — J069 Acute upper respiratory infection, unspecified: Secondary | ICD-10-CM

## 2024-02-17 DIAGNOSIS — J101 Influenza due to other identified influenza virus with other respiratory manifestations: Secondary | ICD-10-CM

## 2024-02-17 LAB — POCT INFLUENZA A/B
Influenza A, POC: POSITIVE — AB
Influenza B, POC: NEGATIVE

## 2024-02-17 MED ORDER — OSELTAMIVIR PHOSPHATE 75 MG PO CAPS: 75.0000 mg | ORAL_CAPSULE | Freq: Two times a day (BID) | ORAL | 0 refills | Status: AC

## 2024-02-17 NOTE — Discharge Instructions (Signed)

## 2024-02-17 NOTE — ED Provider Notes (Signed)
 " Devin [name] [facility]ARE    CSN: [id number] Arrival date & time: 02/17/24  0944      History   Chief Complaint Chief Complaint  Patient presents with   Chills   Cough    HPI Devin [name] is a 53 y.o. male.   Pt presents today due to cough, body aches, chest tightness, fatigue, and loss of appetite since Wednesday. Pt states that he has been using Advil  for symptoms with mild relief. Pt states that he has some sick contacts at work. Pt denies fever.   The history is provided by the patient.  Cough   Past Medical History:  Diagnosis Date   Asthma    Depression    Diabetes mellitus without complication (HCC)    Insomnia     Patient Active Problem List   Diagnosis Date Noted   Abscess of knee, left 11/15/2016   Bedbug bite 10/06/2016   Type 2 diabetes mellitus without complication, without long-term current use of insulin (HCC) 09/05/2016   Hyperlipidemia associated with type 2 diabetes mellitus (HCC) 09/05/2016   Insomnia 09/01/2016   Routine health maintenance 09/01/2016   Depression, recurrent 09/21/2015   Major depressive disorder, recurrent episode, moderate (HCC) 09/21/2015    Past Surgical History:  Procedure Laterality Date   EYE SURGERY     Cataract removed   TONSILLECTOMY         Home Medications    Prior to Admission medications  Medication Sig Start Date End Date Taking? Authorizing Provider  albuterol  (PROVENTIL  HFA;VENTOLIN  HFA) 108 (90 Base) MCG/ACT inhaler Inhale 2 puffs into the lungs every 4 (four) hours as needed for wheezing or shortness of breath. 11/15/16  Yes Danford, Rockie D, NP  atorvastatin  (LIPITOR) 10 MG tablet 1/2 tablet once a day. 11/18/16  Yes Danford, Rockie D, NP  metFORMIN  (GLUCOPHAGE ) 1000 MG tablet Take 1,000 mg by mouth 2 (two) times daily. 10/30/23  Yes [provider]  blood glucose meter kit and supplies KIT Dispense based on patient and insurance preference. Use up to four times daily as directed. DX E11.9  09/06/16   Danford, Rockie D, NP  doxycycline  (VIBRA -TABS) 100 MG tablet Take 1 tablet (100 mg total) by mouth 2 (two) times daily. Patient not taking: Reported on 02/17/2024 11/15/16   Jonel Rockie D, NP  escitalopram  (LEXAPRO ) 20 MG tablet Take 1 tablet (20 mg total) by mouth daily. Patient not taking: Reported on 02/17/2024 11/15/16   Jonel Rockie D, NP  Glucose Blood (BLOOD GLUCOSE TEST STRIPS) STRP Test four times a day as needed.  DX E11.9 09/06/16   Jonel Rockie D, NP  Lancets MISC Test four times daily as needed.  DX E11.9 09/06/16   Danford, Rockie D, NP  metFORMIN  (GLUCOPHAGE ) 500 MG tablet Take 1 tablet (500 mg total) by mouth 2 (two) times daily with a meal. Patient not taking: Reported on 02/17/2024 09/05/16   Jonel Rockie D, NP  montelukast  (SINGULAIR ) 10 MG tablet Take 1 tablet (10 mg total) by mouth at bedtime. Patient not taking: Reported on 02/17/2024 11/15/16   Jonel Rockie D, NP  oxyCODONE-acetaminophen  (PERCOCET/ROXICET) 5-325 MG tablet Take 1-2 tablets by mouth. Patient not taking: Reported on 02/17/2024 03/16/17   [provider]  traZODone  (DESYREL ) 100 MG tablet Take 1 tablet (100 mg total) by mouth at bedtime as needed for sleep. Patient not taking: Reported on 02/17/2024 09/05/16   Jonel Rockie D, NP  Vitamin D , Ergocalciferol , (DRISDOL ) 50000 units CAPS capsule Take 1 capsule (  50,000 Units total) by mouth every 7 (seven) days. Patient not taking: Reported on 02/17/2024 09/05/16   Jonel Rockie BIRCH, NP    Family History Family History  Problem Relation Age of Onset   Cancer Mother    Alzheimer's disease Mother    Cancer Father    Hypertension Father     Social History Social History[1]   Allergies   Patient has no known allergies.   Review of Systems Review of Systems  Respiratory:  Positive for cough.      Physical Exam Triage Vital Signs ED Triage Vitals  Encounter Vitals Group     BP 02/17/24 1143 129/80     Girls Systolic BP Percentile --       Girls Diastolic BP Percentile --      Boys Systolic BP Percentile --      Boys Diastolic BP Percentile --      Pulse Rate 02/17/24 1143 88     Resp 02/17/24 1143 18     Temp 02/17/24 1143 98.2 F (36.8 C)     Temp Source 02/17/24 1143 Oral     SpO2 02/17/24 1143 96 %     Weight --      Height --      Head Circumference --      Peak Flow --      Pain Score 02/17/24 1138 4     Pain Loc --      Pain Education --      Exclude from Growth Chart --    No data found.  Updated Vital Signs BP 129/80 (BP Location: Left Arm)   Pulse 88   Temp 98.2 F (36.8 C) (Oral)   Resp 18   SpO2 96%   Visual Acuity Right Eye Distance:   Left Eye Distance:   Bilateral Distance:    Right Eye Near:   Left Eye Near:    Bilateral Near:     Physical Exam Vitals and nursing note reviewed.  Constitutional:      General: He is not in acute distress.    Appearance: Normal appearance. He is not ill-appearing, toxic-appearing or diaphoretic.  HENT:     Nose: Congestion (mildly enlarged turbinates) present. No rhinorrhea.     Mouth/Throat:     Mouth: Mucous membranes are moist.     Pharynx: Oropharynx is clear. No oropharyngeal exudate or posterior oropharyngeal erythema.  Eyes:     General: No scleral icterus. Cardiovascular:     Rate and Rhythm: Normal rate and regular rhythm.     Heart sounds: Normal heart sounds.  Pulmonary:     Effort: Pulmonary effort is normal. No respiratory distress.     Breath sounds: Normal breath sounds. No wheezing or rhonchi.  Skin:    General: Skin is warm.  Neurological:     Mental Status: He is alert and oriented to person, place, and time.  Psychiatric:        Mood and Affect: Mood normal.        Behavior: Behavior normal.      UC Treatments / Results  Labs (all labs ordered are listed, but only abnormal results are displayed) Labs Reviewed - No data to display  EKG   Radiology No results found.  Procedures Procedures (including  critical care time)  Medications Ordered in UC Medications - No data to display  Initial Impression / Assessment and Plan / UC Course  I have reviewed the triage vital signs and the nursing notes.  Pertinent labs & imaging results that were available during my care of the patient were reviewed by me and considered in my medical decision making (see chart for details).     Final diagnoses:  None   Discharge Instructions   None    ED Prescriptions   None    PDMP not reviewed this encounter.    [1]  Social History Tobacco Use   Smoking status: Never   Smokeless tobacco: Never  Vaping Use   Vaping status: Never Used  Substance Use Topics   Alcohol use: No   Drug use: No     Andra Corean BROCKS, PA-C 02/17/24 1201  "

## 2024-02-17 NOTE — ED Triage Notes (Signed)
 Pt reports he has had chills, cough, chest hurt with cough, wheezing, body aches, on Wednesday. He has been using his inhaler and taking advil - last dose was 0100. Unknown fever. Pt states he is a diabetic but hasn't checked it in a while.
